# Patient Record
Sex: Female | Born: 1940 | Race: White | Hispanic: No | Marital: Married | Smoking: Former smoker
Health system: Southern US, Community
[De-identification: ages and names within clinical notes are randomized; demographics above are authoritative.]

## PROBLEM LIST (undated history)

## (undated) DIAGNOSIS — I10 Essential (primary) hypertension: Secondary | ICD-10-CM

## (undated) DIAGNOSIS — E039 Hypothyroidism, unspecified: Secondary | ICD-10-CM

## (undated) HISTORY — PX: DILATION AND CURETTAGE OF UTERUS: SHX78

## (undated) HISTORY — DX: Essential (primary) hypertension: I10

## (undated) HISTORY — DX: Hypothyroidism, unspecified: E03.9

---

## 2009-01-02 ENCOUNTER — Emergency Department (HOSPITAL_COMMUNITY): Admission: EM | Admit: 2009-01-02 | Discharge: 2009-01-02 | Payer: Self-pay | Admitting: Emergency Medicine

## 2009-03-30 ENCOUNTER — Emergency Department (HOSPITAL_COMMUNITY): Admission: EM | Admit: 2009-03-30 | Discharge: 2009-03-30 | Payer: Self-pay | Admitting: Emergency Medicine

## 2009-04-01 ENCOUNTER — Emergency Department (HOSPITAL_COMMUNITY): Admission: EM | Admit: 2009-04-01 | Discharge: 2009-04-01 | Payer: Self-pay | Admitting: Emergency Medicine

## 2009-04-04 ENCOUNTER — Emergency Department (HOSPITAL_COMMUNITY): Admission: EM | Admit: 2009-04-04 | Discharge: 2009-04-04 | Payer: Self-pay | Admitting: Emergency Medicine

## 2009-04-08 ENCOUNTER — Ambulatory Visit: Payer: Self-pay | Admitting: Cardiology

## 2009-04-08 DIAGNOSIS — Z87448 Personal history of other diseases of urinary system: Secondary | ICD-10-CM

## 2009-04-08 DIAGNOSIS — I517 Cardiomegaly: Secondary | ICD-10-CM

## 2009-04-08 DIAGNOSIS — I10 Essential (primary) hypertension: Secondary | ICD-10-CM

## 2009-04-14 ENCOUNTER — Ambulatory Visit (HOSPITAL_COMMUNITY): Admission: RE | Admit: 2009-04-14 | Discharge: 2009-04-14 | Payer: Self-pay | Admitting: Urology

## 2009-04-23 ENCOUNTER — Encounter: Payer: Self-pay | Admitting: Cardiology

## 2009-04-23 ENCOUNTER — Ambulatory Visit: Payer: Self-pay

## 2009-04-23 ENCOUNTER — Ambulatory Visit (HOSPITAL_COMMUNITY): Admission: RE | Admit: 2009-04-23 | Discharge: 2009-04-23 | Payer: Self-pay | Admitting: Cardiology

## 2009-04-23 ENCOUNTER — Ambulatory Visit: Payer: Self-pay | Admitting: Internal Medicine

## 2009-04-26 LAB — CONVERTED CEMR LAB
CO2: 28 meq/L (ref 19–32)
Calcium: 9.2 mg/dL (ref 8.4–10.5)
Creatinine, Ser: 0.8 mg/dL (ref 0.4–1.2)
Glucose, Bld: 90 mg/dL (ref 70–99)
Sodium: 135 meq/L (ref 135–145)

## 2009-04-27 ENCOUNTER — Ambulatory Visit: Payer: Self-pay | Admitting: Cardiology

## 2009-04-28 ENCOUNTER — Encounter: Payer: Self-pay | Admitting: Cardiology

## 2009-05-18 LAB — CONVERTED CEMR LAB
Metaneph Total, Ur: 258 ug/24hr (ref 224–832)
Metanephrines, Ur: 36 — ABNORMAL LOW (ref 90–315)

## 2009-05-25 ENCOUNTER — Encounter: Payer: Self-pay | Admitting: Cardiology

## 2009-05-27 ENCOUNTER — Ambulatory Visit: Payer: Self-pay | Admitting: Cardiology

## 2009-09-14 ENCOUNTER — Ambulatory Visit (HOSPITAL_COMMUNITY): Admission: RE | Admit: 2009-09-14 | Discharge: 2009-09-14 | Payer: Self-pay | Admitting: Family Medicine

## 2010-03-23 NOTE — Assessment & Plan Note (Signed)
Summary: [facility]   Visit Type:  Initial Consult Primary Provider:  Ursula Beath, MD  CC:  HTN.  History of Present Illness: This patient presents as a new workup for evaluation of difficult to control hypertension. She has had hypertension for years. However, recently she has had 3 emergency room visits with hypertensive urgency. I have reviewed your records. Most recently she was seen for days ago. Her symptoms start with her face flushing, heart racing and headaches. She has taken her blood pressure and noted it to be as high as 230/108. At the last ER visit her urinalysis was normal. A chemistry demonstrated a slightly low potassium. Her meds were changed from metoprolol to labetalol. This seems to have controlled her blood pressure as over the last 3 days she has had no elevated readings. She feels better and tolerates the current regimen. She is not having any presyncope or syncope. She does exercise occasionally and describes no chest discomfort, neck or arm discomfort. She does not describe shortness of breath, PND or orthopnea.  Of note the patient has a vague history of an enlarged heart seen on echocardiography in the mid 1990s in [place]. She was told she had a heart murmur at that time as well. No other workup was ever undertaken. I do not have these records.  Current Medications (verified): 1)  Nifedical Xl 30 Mg Xr24h-Tab (Nifedipine) .Marland Kitchen.. 1 By Mouth Daily 2)  Cozaar 100 Mg Tabs (Losartan Potassium) .Marland Kitchen.. 1 By Mouth Daily 3)  Levothroid 88 Mcg Tabs (Levothyroxine Sodium) .Marland Kitchen.. 1 By Mouth Daily 4)  Labetalol Hcl 200 Mg Tabs (Labetalol Hcl) .Marland Kitchen.. 1 By Mouth Three Times A Day  Allergies (verified): 1)  ! * Tylenon With Codiene  Past History:  Past Medical History: HTN x years Hematuria Hypothyroid  Past Surgical History: D & C  Family History: There is no family history of early heart disease. There is hypertension in older age first-degree relatives. Her  mother did have a microlumbar and at a later age but died of breast cancer.  Social History: The he is married. She smoked cigarettes only as a teenager. She doesn't drink alcohol. She is retired and has 4 children.  Review of Systems       POtherwise negative for all other systems.  Vital Signs:  Patient profile:   70 year old female Height:      62 inches Weight:      159 pounds BMI:     29.19 Pulse rate:   57 / minute Resp:     16 per minute BP sitting:   130 / 70  (right arm)  Vitals Entered By: Marrion Coy, CNA (April 08, 2009 9:04 AM)  Physical Exam  General:  Well developed, well nourished, in no acute distress. Head:  normocephalic and atraumatic Eyes:  PERRLA/EOM intact; conjunctiva and lids normal. Mouth:  Teeth, gums and palate normal. Oral mucosa normal. Neck:  Neck supple, no JVD. No masses, thyromegaly or abnormal cervical nodes. Chest Wall:  no deformities or breast masses noted Lungs:  Clear bilaterally to auscultation and percussion.rales R base.  rales R base.   Abdomen:  Bowel sounds positive; abdomen soft and non-tender without masses, organomegaly, or hernias noted. No hepatosplenomegaly. Msk:  Back normal, normal gait. Muscle strength and tone normal. Extremities:  No clubbing or cyanosis. Neurologic:  Alert and oriented x 3. Skin:  Intact without lesions or rashes. Cervical Nodes:  no significant adenopathy Axillary Nodes:  no significant adenopathy Inguinal  Nodes:  no significant adenopathy Psych:  Normal affect.   Detailed Cardiovascular Exam  Neck    Carotids: Carotids full and equal bilaterally without bruits.      Neck Veins: Normal, no JVD.    Heart    Inspection: no deformities or lifts noted.      Palpation: normal PMI with no thrills palpable.      Auscultation: regular rate and rhythm, S1, S2 without murmurs, rubs, gallops, or clicks.    Vascular    Abdominal Aorta: no palpable masses, pulsations, or audible bruits.       Femoral Pulses: normal femoral pulses bilaterally.      Pedal Pulses: normal pedal pulses bilaterally.      Radial Pulses: normal radial pulses bilaterally.      Peripheral Circulation: no clubbing, cyanosis, or edema noted with normal capillary refill.     EKG  Procedure date:  04/08/2009  Findings:      sinus bradycardia, rate 57, axis within normal limits, early repolarization pattern, no acute ST-T wave changes.  Impression & Recommendations:  Problem # 1:  HYPERTENSION, MILD (ICD-401.1) The patient has had paroxysms of hypertensive urgency. She seems to be well treated currently. I will continue the same medicines. I will order a 24-hour urine for VMA and metanephrine. I will check an aldosterone level. Further evaluation will be pending future blood pressure readings and the results of the above.  Orders: EKG w/ Interpretation (93000) Echocardiogram (Echo)  Problem # 2:  VENTRICULAR HYPERTROPHY, LEFT (ICD-429.3) The Patient reports a history of "cardiac enlargement". I suspect LVH. She does have a slightly abnormal EKG which is unusual for her age. I will followup and echocardiogram. Orders: EKG w/ Interpretation (93000) Echocardiogram (Echo)  Problem # 3:  HEMATURIA, HX OF (ICD-V13.09) She has been seeing a urologist for this and evaluation is ongoing.  Patient Instructions: 1)  Your physician recommends that you schedule a follow-up appointment after testing as needed 2)  Your physician recommends that you return for lab work on same day as your echo  24 hr urine for VMA/meta, aldosterone level, and a basic metabolic panel  401.1 3)  Your physician recommends that you continue on your current medications as directed. Please refer to the Current Medication list given to you today. 4)  Your physician has requested that you have an echocardiogram.  Echocardiography is a painless test that uses sound waves to create images of your heart. It provides your doctor with information  about the size and shape of your heart and how well your heart's chambers and valves are working.  This procedure takes approximately one hour. There are no restrictions for this procedure. Prescriptions: LABETALOL HCL 200 MG TABS (LABETALOL HCL) 1 by mouth three times a day  #90 x 11   Entered by:   Charolotte Capuchin, RN   Authorized by:   Rollene Rotunda, MD, [facility]   Signed by:   Charolotte Capuchin, RN on 04/08/2009   Method used:   Electronically to        [facility] [address] [phone]* (retail)       [address], [place]  [address]       Ph: [phone]       Fax: [phone]   RxID:   [id number]

## 2010-03-23 NOTE — Miscellaneous (Signed)
  Clinical Lists Changes  Observations: Added new observation of ECHOINTERP: - Left ventricle: Wall thickness was increased in a pattern of mild       LVH. Systolic function was normal. The estimated ejection fraction       was in the range of 60% to 65%. Doppler parameters are consistent       with abnormal left ventricular relaxation (grade 1 diastolic       dysfunction).     - Mitral valve: Calcified annulus. Mildly thickened leaflets . (04/23/2009 12:13)      Echocardiogram  Procedure date:  04/23/2009  Findings:      - Left ventricle: Wall thickness was increased in a pattern of mild       LVH. Systolic function was normal. The estimated ejection fraction       was in the range of 60% to 65%. Doppler parameters are consistent       with abnormal left ventricular relaxation (grade 1 diastolic       dysfunction).     - Mitral valve: Calcified annulus. Mildly thickened leaflets .

## 2010-03-23 NOTE — Assessment & Plan Note (Signed)
Summary: [facility]   Visit Type:  Follow-up Primary Provider:  Ursula Beath, MD  CC:  HTN.  History of Present Illness: The patient presents for followup of difficult to control hypertension. Since I last saw her she has had no new complaints. She actually brings a blood pressure diary which demonstrates very well-controlled blood pressures. She's been given a medication for anxiety which she has to take rarely. Of note at the last visit there was a question of a previous diagnosis of cardiomegaly on ultrasound years ago in [place]. An echocardiogram that I ordered demonstrated a well-preserved ejection fraction and very mild LVH. She denies any new symptoms such as chest discomfort, neck or arm discomfort. She is not having palpitations, presyncope or syncope. She has no shortness of breath, PND or orthopnea.  Current Medications (verified): 1)  Nifedical Xl 30 Mg Xr24h-Tab (Nifedipine) .Marland Kitchen.. 1 By Mouth Daily 2)  Cozaar 100 Mg Tabs (Losartan Potassium) .Marland Kitchen.. 1 By Mouth Daily 3)  Levothroid 75 Mcg Tabs (Levothyroxine Sodium) .Marland Kitchen.. 1 P Odaily 4)  Labetalol Hcl 200 Mg Tabs (Labetalol Hcl) .Marland Kitchen.. 1 By Mouth Three Times A Day 5)  Tranxene-T 3.75 Mg Tabs (Clorazepate Dipotassium) .... As Needed  Allergies (verified): 1)  ! * Tylenon With Codiene  Past History:  Past Medical History: Reviewed history from 04/08/2009 and no changes required. HTN x years Hematuria Hypothyroid  Past Surgical History: Reviewed history from 04/08/2009 and no changes required. D & C  Review of Systems       As stated in the HPI and negative for all other systems.   Vital Signs:  Patient profile:   70 year old female Height:      62 inches Weight:      157 pounds BMI:     28.82 Pulse rate:   55 / minute Resp:     16 per minute BP sitting:   148 / 78  (right arm)  Vitals Entered By: Marrion Coy, CNA (May 27, 2009 1:33 PM)  Physical Exam  General:  Well developed, well nourished, in  no acute distress. Head:  normocephalic and atraumatic Neck:  Neck supple, no JVD. No masses, thyromegaly or abnormal cervical nodes. Chest Wall:  no deformities or breast masses noted Lungs:  Clear bilaterally to auscultation and percussion.rales R base.  rales R base.   Abdomen:  Bowel sounds positive; abdomen soft and non-tender without masses, organomegaly, or hernias noted. No hepatosplenomegaly. Msk:  Back normal, normal gait. Muscle strength and tone normal. Extremities:  No clubbing or cyanosis. Neurologic:  Alert and oriented x 3. Skin:  Intact without lesions or rashes. Psych:  Normal affect.   Impression & Recommendations:  Problem # 1:  HYPERTENSION, MILD (ICD-401.1) The patient's blood pressure is well controlled. No change in therapy is indicated. She had negative VMA and metanephrines. At this point I don't think further evaluation for secondary etiology is warranted. If in the future her blood pressure is more difficult to control that there is evidence of renal insufficiency she would need renal artery Dopplers. I did review a recent CAT scan done at [facility] which was ordered by her urologist. There were no significant abnormalities though I don't see specific tension of her renal arteries on this contrast study.  Problem # 2:  VENTRICULAR HYPERTROPHY, LEFT (ICD-429.3) This is mild and should be treated with blood pressure management but no other evaluation is needed.

## 2010-04-07 ENCOUNTER — Encounter: Payer: Self-pay | Admitting: Cardiology

## 2010-04-20 ENCOUNTER — Other Ambulatory Visit (HOSPITAL_COMMUNITY): Payer: Self-pay | Admitting: Family Medicine

## 2010-04-20 DIAGNOSIS — D329 Benign neoplasm of meninges, unspecified: Secondary | ICD-10-CM

## 2010-04-23 ENCOUNTER — Ambulatory Visit (HOSPITAL_COMMUNITY)
Admission: RE | Admit: 2010-04-23 | Discharge: 2010-04-23 | Disposition: A | Discharge status: Home / Self Care | Payer: Medicare HMO | Source: Ambulatory Visit | Attending: Family Medicine | Admitting: Family Medicine

## 2010-04-23 DIAGNOSIS — D320 Benign neoplasm of cerebral meninges: Secondary | ICD-10-CM | POA: Insufficient documentation

## 2010-04-23 DIAGNOSIS — D329 Benign neoplasm of meninges, unspecified: Secondary | ICD-10-CM

## 2010-04-23 DIAGNOSIS — Z09 Encounter for follow-up examination after completed treatment for conditions other than malignant neoplasm: Secondary | ICD-10-CM | POA: Insufficient documentation

## 2010-04-23 MED ORDER — IOHEXOL 300 MG/ML  SOLN
75.0000 mL | Freq: Once | INTRAMUSCULAR | Status: AC | PRN
  Administered 2010-04-23: 75 mL via INTRAVENOUS

## 2010-05-13 LAB — CBC
HCT: 42.2 % (ref 36.0–46.0)
HCT: 46.2 % — ABNORMAL HIGH (ref 36.0–46.0)
Hemoglobin: 14.4 g/dL (ref 12.0–15.0)
Hemoglobin: 15.8 g/dL — ABNORMAL HIGH (ref 12.0–15.0)
MCHC: 34.2 g/dL (ref 30.0–36.0)
RBC: 4.66 MIL/uL (ref 3.87–5.11)
RDW: 12.4 % (ref 11.5–15.5)
WBC: 10.1 10*3/uL (ref 4.0–10.5)

## 2010-05-13 LAB — URINE CULTURE
Colony Count: NO GROWTH
Culture: NO GROWTH

## 2010-05-13 LAB — POCT CARDIAC MARKERS
CKMB, poc: 3.3 ng/mL (ref 1.0–8.0)
Troponin i, poc: <0.05 ng/mL (ref 0.00–0.09)

## 2010-05-13 LAB — DIFFERENTIAL
Basophils Absolute: 0 10*3/uL (ref 0.0–0.1)
Basophils Relative: 0 % (ref 0–1)
Eosinophils Absolute: 0.1 10*3/uL (ref 0.0–0.7)
Eosinophils Absolute: 0.1 10*3/uL (ref 0.0–0.7)
Eosinophils Relative: 1 % (ref 0–5)
Eosinophils Relative: 1 % (ref 0–5)
Lymphocytes Relative: 23 % (ref 12–46)
Lymphs Abs: 2 10*3/uL (ref 0.7–4.0)
Lymphs Abs: 2.1 10*3/uL (ref 0.7–4.0)
Monocytes Absolute: 0.5 10*3/uL (ref 0.1–1.0)
Monocytes Relative: 5 % (ref 3–12)
Neutro Abs: 7.2 10*3/uL (ref 1.7–7.7)
Neutrophils Relative %: 72 % (ref 43–77)

## 2010-05-13 LAB — URINE MICROSCOPIC-ADD ON

## 2010-05-13 LAB — URINALYSIS, ROUTINE W REFLEX MICROSCOPIC
Bilirubin Urine: NEGATIVE
Leukocytes, UA: NEGATIVE
Nitrite: NEGATIVE
Urobilinogen, UA: 0.2 mg/dL (ref 0.0–1.0)
pH: 5.5 (ref 5.0–8.0)

## 2010-05-13 LAB — COMPREHENSIVE METABOLIC PANEL
ALT: 21 U/L (ref 0–35)
Albumin: 4.4 g/dL (ref 3.5–5.2)
BUN: 13 mg/dL (ref 6–23)
CO2: 25 mEq/L (ref 19–32)
Calcium: 9.8 mg/dL (ref 8.4–10.5)
Creatinine, Ser: 0.81 mg/dL (ref 0.4–1.2)
GFR calc Af Amer: >60 mL/min (ref >60)
Glucose, Bld: 115 mg/dL — ABNORMAL HIGH (ref 70–99)
Sodium: 136 mEq/L (ref 135–145)

## 2010-05-13 LAB — BASIC METABOLIC PANEL
GFR calc Af Amer: >60 mL/min (ref >60)
Potassium: 3.4 mEq/L — ABNORMAL LOW (ref 3.5–5.1)
Sodium: 133 mEq/L — ABNORMAL LOW (ref 135–145)

## 2010-05-26 LAB — URINALYSIS, ROUTINE W REFLEX MICROSCOPIC
Glucose, UA: NEGATIVE mg/dL
Leukocytes, UA: NEGATIVE
Protein, ur: NEGATIVE mg/dL
Urobilinogen, UA: 0.2 mg/dL (ref 0.0–1.0)

## 2010-05-26 LAB — BASIC METABOLIC PANEL
BUN: 16 mg/dL (ref 6–23)
Calcium: 10 mg/dL (ref 8.4–10.5)

## 2010-05-26 LAB — URINE CULTURE

## 2010-05-26 LAB — URINE MICROSCOPIC-ADD ON

## 2010-07-27 ENCOUNTER — Other Ambulatory Visit (HOSPITAL_COMMUNITY): Payer: Self-pay | Admitting: Family Medicine

## 2010-07-27 DIAGNOSIS — Z139 Encounter for screening, unspecified: Secondary | ICD-10-CM

## 2010-09-08 ENCOUNTER — Other Ambulatory Visit: Payer: Self-pay | Admitting: *Deleted

## 2010-09-08 MED ORDER — LABETALOL HCL 200 MG PO TABS: 200.0000 mg | ORAL_TABLET | Freq: Three times a day (TID) | ORAL | Status: DC

## 2010-09-17 ENCOUNTER — Ambulatory Visit (HOSPITAL_COMMUNITY)
Admission: RE | Admit: 2010-09-17 | Discharge: 2010-09-17 | Disposition: A | Discharge status: Home / Self Care | Payer: Medicare HMO | Source: Ambulatory Visit | Attending: Family Medicine | Admitting: Family Medicine

## 2010-09-17 DIAGNOSIS — Z1231 Encounter for screening mammogram for malignant neoplasm of breast: Secondary | ICD-10-CM | POA: Insufficient documentation

## 2010-09-17 DIAGNOSIS — Z139 Encounter for screening, unspecified: Secondary | ICD-10-CM

## 2010-09-30 ENCOUNTER — Encounter: Payer: Self-pay | Admitting: *Deleted

## 2010-09-30 ENCOUNTER — Encounter: Payer: Self-pay | Admitting: Cardiology

## 2010-10-01 ENCOUNTER — Encounter: Payer: Self-pay | Admitting: Cardiology

## 2011-05-02 ENCOUNTER — Other Ambulatory Visit: Payer: Self-pay | Admitting: Cardiology

## 2011-05-02 NOTE — Telephone Encounter (Signed)
..   Requested Prescriptions   Pending Prescriptions Disp Refills  . labetalol (NORMODYNE) 200 MG tablet [Pharmacy Med Name: LABETALOL HCL 200MG  TABLE] 90 tablet 1    Sig: TAKE (1) TABLET BY MOUTH (3) TIMES DAILY.  Pt needs to make an appointment to continue getting meds

## 2011-05-04 ENCOUNTER — Encounter: Payer: Self-pay | Admitting: Cardiology

## 2011-05-04 ENCOUNTER — Ambulatory Visit (INDEPENDENT_AMBULATORY_CARE_PROVIDER_SITE_OTHER): Payer: Medicare Other | Admitting: Cardiology

## 2011-05-04 VITALS — BP 150/70 | HR 47 | Ht 62.0 in | Wt 164.0 lb

## 2011-05-04 DIAGNOSIS — R0989 Other specified symptoms and signs involving the circulatory and respiratory systems: Secondary | ICD-10-CM

## 2011-05-04 DIAGNOSIS — I10 Essential (primary) hypertension: Secondary | ICD-10-CM

## 2011-05-04 DIAGNOSIS — I517 Cardiomegaly: Secondary | ICD-10-CM

## 2011-05-04 NOTE — Patient Instructions (Signed)
Continue current medications as listed.  Your physician has requested that you have a carotid duplex. This test is an ultrasound of the carotid arteries in your neck. It looks at blood flow through these arteries that supply the brain with blood. Allow one hour for this exam. There are no restrictions or special instructions.  Follow up in 1 year with Dr Antoine Poche.  You will receive a letter in the mail 2 months before you are due.  Please call us when you receive this letter to schedule your follow up appointment.

## 2011-05-04 NOTE — Assessment & Plan Note (Signed)
The blood pressure is at target. No change in medications is indicated. We will continue with therapeutic lifestyle changes (TLC).  

## 2011-05-04 NOTE — Assessment & Plan Note (Signed)
She does have a left carotid bruit which I didn't appreciate previously. I will get a carotid Doppler.

## 2011-05-04 NOTE — Assessment & Plan Note (Signed)
This was evaluated the previous echocardiogram. No further evaluation is warranted.

## 2011-05-04 NOTE — Progress Notes (Signed)
   HPI   The patient presents for followup of her difficult to control blood pressure. She's had previous workup of this including renal Dopplers and screening for pheochromocytoma. There's been no clear etiology. She does have LVH somewhat on echo. Since last saw her she's had excellent blood pressure control. She has had none of the hypertensive urgency he was having. The patient denies any new symptoms such as chest discomfort, neck or arm discomfort. There has been no new shortness of breath, PND or orthopnea. There have been no reported palpitations, presyncope or syncope.  She's not exercising as much as I would like right now though she plans on getting back into this.  Allergies  Allergen Reactions  . Percocet (Oxycodone-Acetaminophen)     Current Outpatient Prescriptions  Medication Sig Dispense Refill  . labetalol (NORMODYNE) 200 MG tablet TAKE (1) TABLET BY MOUTH (3) TIMES DAILY.  90 tablet  1  . levothyroxine (SYNTHROID, LEVOTHROID) 75 MCG tablet Take 75 mcg by mouth daily.        Marland Kitchen losartan (COZAAR) 100 MG tablet Take 100 mg by mouth daily.        Marland Kitchen NIFEdipine (PROCARDIA XL/ADALAT-CC) 30 MG 24 hr tablet Take 30 mg by mouth daily.          Past Medical History  Diagnosis Date  . HTN (hypertension)   . Hematuria   . Hypothyroid     Past Surgical History  Procedure Date  . Dilation and curettage of uterus     ROS:  As stated in the HPI and negative for all other systems.  PHYSICAL EXAM BP 150/70  Pulse 47  Ht 5\' 2"  (1.575 m)  Wt 164 lb (74.39 kg)  BMI 30.00 kg/m2 GENERAL:  Well appearing HEENT:  Pupils equal round and reactive, fundi not visualized, oral mucosa unremarkable NECK:  No jugular venous distention, waveform within normal limits, carotid upstroke brisk and symmetric, left bruits, no thyromegaly LYMPHATICS:  No cervical, inguinal adenopathy LUNGS:  Clear to auscultation bilaterally BACK:  No CVA tenderness CHEST:  Unremarkable HEART:  PMI not displaced  or sustained,S1 and S2 within normal limits, no S3, no S4, no clicks, no rubs, soft left upper murmur systolic early peaking. ABD:  Flat, positive bowel sounds normal in frequency in pitch, no bruits, no rebound, no guarding, no midline pulsatile mass, no hepatomegaly, no splenomegaly EXT:  2 plus pulses throughout, no edema, no cyanosis no clubbing SKIN:  No rashes no nodules NEURO:  Cranial nerves II through XII grossly intact, motor grossly intact throughout PSYCH:  Cognitively intact, oriented to person place and time  EKG:  Sinus bradycardia, rate 47, axis within normal, intervals within normal limits, early repolarization.  05/04/2011   ASSESSMENT AND PLAN

## 2011-05-13 ENCOUNTER — Encounter (INDEPENDENT_AMBULATORY_CARE_PROVIDER_SITE_OTHER): Payer: Medicare Other

## 2011-05-13 DIAGNOSIS — R0989 Other specified symptoms and signs involving the circulatory and respiratory systems: Secondary | ICD-10-CM

## 2011-07-01 ENCOUNTER — Other Ambulatory Visit: Payer: Self-pay | Admitting: Cardiology

## 2011-09-05 ENCOUNTER — Other Ambulatory Visit (HOSPITAL_COMMUNITY): Payer: Self-pay | Admitting: Family Medicine

## 2011-09-05 DIAGNOSIS — Z139 Encounter for screening, unspecified: Secondary | ICD-10-CM

## 2011-09-20 ENCOUNTER — Ambulatory Visit (HOSPITAL_COMMUNITY)
Admission: RE | Admit: 2011-09-20 | Discharge: 2011-09-20 | Disposition: A | Discharge status: Home / Self Care | Payer: Medicare Other | Source: Ambulatory Visit | Attending: Family Medicine | Admitting: Family Medicine

## 2011-09-20 DIAGNOSIS — Z139 Encounter for screening, unspecified: Secondary | ICD-10-CM

## 2011-09-20 DIAGNOSIS — Z1231 Encounter for screening mammogram for malignant neoplasm of breast: Secondary | ICD-10-CM | POA: Insufficient documentation

## 2012-07-04 ENCOUNTER — Encounter: Payer: Self-pay | Admitting: Cardiology

## 2012-07-04 ENCOUNTER — Ambulatory Visit (INDEPENDENT_AMBULATORY_CARE_PROVIDER_SITE_OTHER): Payer: Medicare Other | Admitting: Cardiology

## 2012-07-04 VITALS — BP 141/69 | HR 55 | Ht 62.0 in | Wt 165.0 lb

## 2012-07-04 DIAGNOSIS — I10 Essential (primary) hypertension: Secondary | ICD-10-CM

## 2012-07-04 MED ORDER — LABETALOL HCL 200 MG PO TABS: 200.0000 mg | ORAL_TABLET | Freq: Three times a day (TID) | ORAL | Status: DC

## 2012-07-04 MED ORDER — LOSARTAN POTASSIUM 100 MG PO TABS: 100.0000 mg | ORAL_TABLET | Freq: Every day | ORAL | Status: DC

## 2012-07-04 MED ORDER — NIFEDIPINE ER OSMOTIC RELEASE 30 MG PO TB24: 30.0000 mg | ORAL_TABLET | Freq: Every day | ORAL | Status: DC

## 2012-07-04 NOTE — Progress Notes (Signed)
   HPI   The patient presents for followup of her difficult to control blood pressure. She's had previous workup of this including renal Dopplers and screening for pheochromocytoma. There's been no clear etiology. She does have LVH somewhat on echo.  Since last saw her she's had excellent blood pressure control. She has had none of the hypertensive urgency she was having and her husband reports no panic.. The patient denies any new symptoms such as chest discomfort, neck or arm discomfort. There has been no new shortness of breath, PND or orthopnea. There have been no reported palpitations, presyncope or syncope.  She's not exercising as much as I would like although she is active all time.  Allergies  Allergen Reactions  . Percocet (Oxycodone-Acetaminophen)     Current Outpatient Prescriptions  Medication Sig Dispense Refill  . labetalol (NORMODYNE) 200 MG tablet Take 200 mg by mouth 3 (three) times daily.      Marland Kitchen levothyroxine (SYNTHROID, LEVOTHROID) 75 MCG tablet Take 75 mcg by mouth daily.        Marland Kitchen losartan (COZAAR) 100 MG tablet Take 100 mg by mouth daily.        Marland Kitchen NIFEdipine (PROCARDIA XL/ADALAT-CC) 30 MG 24 hr tablet Take 30 mg by mouth daily.         No current facility-administered medications for this visit.    Past Medical History  Diagnosis Date  . HTN (hypertension)   . Hematuria   . Hypothyroid     Past Surgical History  Procedure Laterality Date  . Dilation and curettage of uterus      ROS:  As stated in the HPI and negative for all other systems.  PHYSICAL EXAM BP 141/69  Pulse 55  Ht 5\' 2"  (1.575 m)  Wt 165 lb (74.844 kg)  BMI 30.17 kg/m2 GENERAL:  Well appearing HEENT:  Pupils equal round and reactive, fundi not visualized, oral mucosa unremarkable NECK:  No jugular venous distention, waveform within normal limits, carotid upstroke brisk and symmetric, no bruits, no thyromegaly LYMPHATICS:  No cervical, inguinal adenopathy LUNGS:  Clear to auscultation  bilaterally BACK:  No CVA tenderness CHEST:  Unremarkable HEART:  PMI not displaced or sustained,S1 and S2 within normal limits, no S3, no S4, no clicks, no rubs, no murmurs ABD:  Flat, positive bowel sounds normal in frequency in pitch, no bruits, no rebound, no guarding, no midline pulsatile mass, no hepatomegaly, no splenomegaly EXT:  2 plus pulses throughout, no edema, no cyanosis no clubbing SKIN:  No rashes no nodules NEURO:  Cranial nerves II through XII grossly intact, motor grossly intact throughout PSYCH:  Cognitively intact, oriented to person place and time   ASSESSMENT AND PLAN  HTN:  Her blood pressure is well controlled. She will continue the meds as listed. No change in therapy is indicated.  She prefers to be followed in this clinic yearly given her previous problems with blood pressure.

## 2012-07-04 NOTE — Patient Instructions (Addendum)
The current medical regimen is effective;  continue present plan and medications.  Follow up in 1 year with Dr Hochrein.  You will receive a letter in the mail 2 months before you are due.  Please call us when you receive this letter to schedule your follow up appointment.  

## 2012-09-04 ENCOUNTER — Other Ambulatory Visit (HOSPITAL_COMMUNITY): Payer: Self-pay | Admitting: Family Medicine

## 2012-09-04 DIAGNOSIS — Z139 Encounter for screening, unspecified: Secondary | ICD-10-CM

## 2012-09-20 ENCOUNTER — Ambulatory Visit (HOSPITAL_COMMUNITY)
Admission: RE | Admit: 2012-09-20 | Discharge: 2012-09-20 | Disposition: A | Discharge status: Home / Self Care | Payer: Medicare Other | Source: Ambulatory Visit | Attending: Family Medicine | Admitting: Family Medicine

## 2012-09-20 DIAGNOSIS — Z139 Encounter for screening, unspecified: Secondary | ICD-10-CM

## 2012-09-20 DIAGNOSIS — Z1231 Encounter for screening mammogram for malignant neoplasm of breast: Secondary | ICD-10-CM | POA: Insufficient documentation

## 2012-09-21 ENCOUNTER — Other Ambulatory Visit: Payer: Self-pay | Admitting: Family Medicine

## 2012-09-21 DIAGNOSIS — R928 Other abnormal and inconclusive findings on diagnostic imaging of breast: Secondary | ICD-10-CM

## 2012-10-10 ENCOUNTER — Ambulatory Visit (HOSPITAL_COMMUNITY)
Admission: RE | Admit: 2012-10-10 | Discharge: 2012-10-10 | Disposition: A | Discharge status: Home / Self Care | Payer: Medicare Other | Source: Ambulatory Visit | Attending: Family Medicine | Admitting: Family Medicine

## 2012-10-10 DIAGNOSIS — Z1231 Encounter for screening mammogram for malignant neoplasm of breast: Secondary | ICD-10-CM | POA: Insufficient documentation

## 2012-10-10 DIAGNOSIS — R928 Other abnormal and inconclusive findings on diagnostic imaging of breast: Secondary | ICD-10-CM

## 2013-05-03 ENCOUNTER — Other Ambulatory Visit: Payer: Self-pay

## 2013-05-03 MED ORDER — LOSARTAN POTASSIUM 100 MG PO TABS: 100.0000 mg | ORAL_TABLET | Freq: Every day | ORAL | Status: DC

## 2013-05-03 MED ORDER — NIFEDIPINE ER OSMOTIC RELEASE 30 MG PO TB24: 30.0000 mg | ORAL_TABLET | Freq: Every day | ORAL | Status: DC

## 2013-05-03 MED ORDER — LABETALOL HCL 200 MG PO TABS: 200.0000 mg | ORAL_TABLET | Freq: Three times a day (TID) | ORAL | Status: DC

## 2013-06-15 ENCOUNTER — Other Ambulatory Visit: Payer: Self-pay | Admitting: Cardiology

## 2013-07-03 ENCOUNTER — Other Ambulatory Visit: Payer: Self-pay | Admitting: Cardiology

## 2013-07-17 ENCOUNTER — Ambulatory Visit (INDEPENDENT_AMBULATORY_CARE_PROVIDER_SITE_OTHER): Payer: Medicare HMO | Admitting: Cardiology

## 2013-07-17 ENCOUNTER — Encounter: Payer: Self-pay | Admitting: Cardiology

## 2013-07-17 VITALS — BP 110/63 | HR 49 | Ht 62.0 in | Wt 167.0 lb

## 2013-07-17 DIAGNOSIS — I10 Essential (primary) hypertension: Secondary | ICD-10-CM

## 2013-07-17 DIAGNOSIS — I517 Cardiomegaly: Secondary | ICD-10-CM

## 2013-07-17 MED ORDER — LABETALOL HCL 200 MG PO TABS: ORAL_TABLET | ORAL | Status: DC

## 2013-07-17 MED ORDER — LOSARTAN POTASSIUM 100 MG PO TABS
ORAL_TABLET | ORAL | Status: DC
Start: 2013-07-17 — End: 2014-07-29

## 2013-07-17 MED ORDER — NIFEDIPINE ER OSMOTIC RELEASE 30 MG PO TB24: ORAL_TABLET | ORAL | Status: DC

## 2013-07-17 NOTE — Patient Instructions (Signed)
The current medical regimen is effective;  continue present plan and medications.  Follow up in 1 year with Dr Hochrein.  You will receive a letter in the mail 2 months before you are due.  Please call us when you receive this letter to schedule your follow up appointment.  

## 2013-07-17 NOTE — Progress Notes (Signed)
   HPI   The patient presents for followup of her difficult to control blood pressure. She's had previous workup of this including renal Dopplers and screening for pheochromocytoma. There's been no clear etiology. She does have LVH mildly on echo. Since last saw her one year ago she's had excellent blood pressure control. She has had none of the hypertensive urgency she has had or ER visits. . The patient denies any new symptoms such as chest discomfort, neck or arm discomfort. There has been no new shortness of breath, PND or orthopnea. There have been no reported palpitations, presyncope or syncope.  She's not exercising as much as I would like although she is active all time at home.   Allergies  Allergen Reactions  . Percocet [Oxycodone-Acetaminophen]     Current Outpatient Prescriptions  Medication Sig Dispense Refill  . labetalol (NORMODYNE) 200 MG tablet TAKE (1) TABLET BY MOUTH (3) TIMES DAILY.  90 tablet  1  . levothyroxine (SYNTHROID, LEVOTHROID) 75 MCG tablet Take 75 mcg by mouth daily.        Marland Kitchen losartan (COZAAR) 100 MG tablet TAKE ONE TABLET BY MOUTH ONCE DAILY.  30 tablet  0  . NIFEdipine (PROCARDIA-XL/ADALAT-CC/NIFEDICAL-XL) 30 MG 24 hr tablet TAKE ONE TABLET DAILY.  90 tablet  1   No current facility-administered medications for this visit.    Past Medical History  Diagnosis Date  . HTN (hypertension)   . Hematuria   . Hypothyroid     Past Surgical History  Procedure Laterality Date  . Dilation and curettage of uterus      ROS:  As stated in the HPI and negative for all other systems.  PHYSICAL EXAM BP 110/63  Pulse 49  Ht 5\' 2"  (1.575 m)  Wt 167 lb (75.751 kg)  BMI 30.54 kg/m2 GENERAL:  Well appearing NECK:  No jugular venous distention, waveform within normal limits, carotid upstroke brisk and symmetric, no bruits, no thyromegaly LUNGS:  Clear to auscultation bilaterally CHEST:  Unremarkable HEART:  PMI not displaced or sustained,S1 and S2 within normal  limits, no S3, no S4, no clicks, no rubs, no murmurs ABD:  Flat, positive bowel sounds normal in frequency in pitch, no bruits, no rebound, no guarding, no midline pulsatile mass, no hepatomegaly, no splenomegaly EXT:  2 plus pulses throughout, no edema, no cyanosis no clubbing  EKG:  Sinus bradycardia, rate 49, axis WNL, intervals WNL, no acute ST T wave changes, early repolarization changes.  07/17/2013  ASSESSMENT AND PLAN  HTN:  Her blood pressure is well controlled. She will continue the meds as listed. No change in therapy is indicated.  She prefers to be followed in this clinic yearly given her previous problems with blood pressure.

## 2013-09-04 ENCOUNTER — Encounter: Payer: Self-pay | Admitting: Cardiology

## 2013-09-04 ENCOUNTER — Ambulatory Visit (INDEPENDENT_AMBULATORY_CARE_PROVIDER_SITE_OTHER): Payer: Medicare HMO | Admitting: Cardiology

## 2013-09-04 VITALS — BP 131/71 | HR 61 | Ht 61.1 in | Wt 161.0 lb

## 2013-09-04 DIAGNOSIS — I517 Cardiomegaly: Secondary | ICD-10-CM

## 2013-09-04 DIAGNOSIS — I10 Essential (primary) hypertension: Secondary | ICD-10-CM

## 2013-09-04 NOTE — Patient Instructions (Addendum)
The current medical regimen is effective;  continue present plan and medications.  Follow up in 3 months with Dr Percival Spanish in the [place] office.

## 2013-09-04 NOTE — Progress Notes (Signed)
   HPI   The patient presents for followup of her difficult to control blood pressure. She's had previous workup of this including renal Dopplers and screening for pheochromocytoma. There's been no clear etiology. She does have LVH mildly on echo.  Since I last saw her her BP has been well controlled.  However, she comes into day for follow up and she is sad, tearful and describes severe stress.  She also says that with this stress she has had some left sided chest pain.  This is like a cramp and under the left breast and in the left axilla.  It does not happen with physical stress and she can walk the dog and do yard work without symptoms.  The patient denies any new symptoms such as neck or arm discomfort. There has been no new shortness of breath, PND or orthopnea. There have been no reported palpitations, presyncope or syncope.  Allergies  Allergen Reactions  . Percocet [Oxycodone-Acetaminophen]     Current Outpatient Prescriptions  Medication Sig Dispense Refill  . labetalol (NORMODYNE) 200 MG tablet TAKE (1) TABLET BY MOUTH (3) TIMES DAILY.  90 tablet  11  . levothyroxine (SYNTHROID, LEVOTHROID) 75 MCG tablet Take 75 mcg by mouth daily.        Marland Kitchen losartan (COZAAR) 100 MG tablet TAKE ONE TABLET BY MOUTH ONCE DAILY.  30 tablet  11  . NIFEdipine (PROCARDIA-XL/ADALAT-CC/NIFEDICAL-XL) 30 MG 24 hr tablet TAKE ONE TABLET DAILY.  30 tablet  11   No current facility-administered medications for this visit.    Past Medical History  Diagnosis Date  . HTN (hypertension)   . Hematuria   . Hypothyroid     Past Surgical History  Procedure Laterality Date  . Dilation and curettage of uterus      ROS:  As stated in the HPI and negative for all other systems.  PHYSICAL EXAM BP 131/71  Pulse 61  Ht 5' 1.1" (1.552 m)  Wt 161 lb (73.029 kg)  BMI 30.32 kg/m2 GENERAL:  Well appearing NECK:  No jugular venous distention, waveform within normal limits, carotid upstroke brisk and symmetric, no  bruits, no thyromegaly LUNGS:  Clear to auscultation bilaterally CHEST:  Unremarkable HEART:  PMI not displaced or sustained,S1 and S2 within normal limits, no S3, no S4, no clicks, no rubs, no murmurs ABD:  Flat, positive bowel sounds normal in frequency in pitch, no bruits, no rebound, no guarding, no midline pulsatile mass, no hepatomegaly, no splenomegaly EXT:  2 plus pulses throughout, no edema, no cyanosis no clubbing PSYCH:   Tearful  EKG:  Sinus bradycardia, rate 49, axis WNL, intervals WNL, no acute ST T wave changes, early repolarization changes.  09/04/2013  ASSESSMENT AND PLAN  HTN:  Her blood pressure is well controlled. She will continue the meds as listed. No change in therapy is indicated.  She prefers to be followed in this clinic yearly given her previous problems with blood pressure.  ANXIETY:  She is very emotional in the office today.  I spent quite a bit of time talking to her and her husband about this.  She needs to see her primary MD about this.

## 2013-09-25 ENCOUNTER — Other Ambulatory Visit (HOSPITAL_COMMUNITY): Payer: Self-pay | Admitting: Family Medicine

## 2013-09-25 DIAGNOSIS — Z1231 Encounter for screening mammogram for malignant neoplasm of breast: Secondary | ICD-10-CM

## 2013-10-14 ENCOUNTER — Ambulatory Visit (HOSPITAL_COMMUNITY)
Admission: RE | Admit: 2013-10-14 | Discharge: 2013-10-14 | Disposition: A | Discharge status: Home / Self Care | Payer: Medicare HMO | Source: Ambulatory Visit | Attending: Family Medicine | Admitting: Family Medicine

## 2013-10-14 DIAGNOSIS — Z1231 Encounter for screening mammogram for malignant neoplasm of breast: Secondary | ICD-10-CM | POA: Diagnosis not present

## 2013-12-18 ENCOUNTER — Encounter: Payer: Self-pay | Admitting: Cardiology

## 2013-12-18 ENCOUNTER — Ambulatory Visit (INDEPENDENT_AMBULATORY_CARE_PROVIDER_SITE_OTHER): Payer: Medicare HMO | Admitting: Cardiology

## 2013-12-18 VITALS — BP 120/62 | HR 42 | Ht 61.5 in | Wt 168.0 lb

## 2013-12-18 DIAGNOSIS — I10 Essential (primary) hypertension: Secondary | ICD-10-CM

## 2013-12-18 NOTE — Patient Instructions (Signed)
The current medical regimen is effective;  continue present plan and medications.  Follow up in 1 year with Dr. Hochrein in [place].  You will receive a letter in the mail 2 months before you are due.  Please call us when you receive this letter to schedule your follow up appointment.  

## 2013-12-18 NOTE — Progress Notes (Signed)
   HPI   The patient presents for followup of her difficult to control blood pressure. She's had previous workup of this including renal Dopplers and screening for pheochromocytoma. There's been no clear etiology. She does have LVH mildly on echo.  At the last visit she was having significant problems with stress and anxiety but this seems to be improved. Her blood pressures well controlled. She does have bradycardia noted that she tolerates this and she has no symptoms related to this. She denies any palpitations, presyncope or syncope.  She has no shortness of breath or chest pain.  Allergies  Allergen Reactions  . Percocet [Oxycodone-Acetaminophen]     Current Outpatient Prescriptions  Medication Sig Dispense Refill  . labetalol (NORMODYNE) 200 MG tablet TAKE (1) TABLET BY MOUTH (3) TIMES DAILY.  90 tablet  11  . levothyroxine (SYNTHROID, LEVOTHROID) 75 MCG tablet Take 75 mcg by mouth daily.        Marland Kitchen losartan (COZAAR) 100 MG tablet TAKE ONE TABLET BY MOUTH ONCE DAILY.  30 tablet  11  . NIFEdipine (PROCARDIA-XL/ADALAT-CC/NIFEDICAL-XL) 30 MG 24 hr tablet TAKE ONE TABLET DAILY.  30 tablet  11   No current facility-administered medications for this visit.    Past Medical History  Diagnosis Date  . HTN (hypertension)   . Hematuria   . Hypothyroid     Past Surgical History  Procedure Laterality Date  . Dilation and curettage of uterus      ROS:  As stated in the HPI and negative for all other systems.  PHYSICAL EXAM BP 120/62  Pulse 42  Ht 5' 1.5" (1.562 m)  Wt 168 lb (76.204 kg)  BMI 31.23 kg/m2 GENERAL:  Well appearing NECK:  No jugular venous distention, waveform within normal limits, carotid upstroke brisk and symmetric, no bruits, no thyromegaly LUNGS:  Clear to auscultation bilaterally CHEST:  Unremarkable HEART:  PMI not displaced or sustained,S1 and S2 within normal limits, no S3, no S4, no clicks, no rubs, no murmurs ABD:  Flat, positive bowel sounds normal in  frequency in pitch, no bruits, no rebound, no guarding, no midline pulsatile mass, no hepatomegaly, no splenomegaly EXT:  2 plus pulses throughout, no edema, no cyanosis no clubbing PSYCH:   Unremarkable.   EKG:  Sinus bradycardia, rate 42, axis WNL, intervals WNL, no acute ST T wave changes, early repolarization changes.  12/18/2013  ASSESSMENT AND PLAN  HTN:  Her blood pressure is well controlled. She will continue the meds as listed. No change in therapy is indicated.  She prefers to be followed in this clinic yearly given her previous problems with blood pressure.  BRADYCARDIA:    She tolerates this. No change in therapy is indicated.

## 2014-04-29 ENCOUNTER — Other Ambulatory Visit (HOSPITAL_COMMUNITY): Payer: Self-pay | Admitting: Family Medicine

## 2014-04-29 DIAGNOSIS — R51 Headache: Principal | ICD-10-CM

## 2014-04-29 DIAGNOSIS — M542 Cervicalgia: Secondary | ICD-10-CM

## 2014-04-29 DIAGNOSIS — R519 Headache, unspecified: Secondary | ICD-10-CM

## 2014-04-29 DIAGNOSIS — D329 Benign neoplasm of meninges, unspecified: Secondary | ICD-10-CM

## 2014-05-05 ENCOUNTER — Ambulatory Visit (HOSPITAL_COMMUNITY)
Admission: RE | Admit: 2014-05-05 | Discharge: 2014-05-05 | Disposition: A | Discharge status: Home / Self Care | Payer: Medicare HMO | Source: Ambulatory Visit | Attending: Family Medicine | Admitting: Family Medicine

## 2014-05-05 DIAGNOSIS — R519 Headache, unspecified: Secondary | ICD-10-CM

## 2014-05-05 DIAGNOSIS — D320 Benign neoplasm of cerebral meninges: Secondary | ICD-10-CM | POA: Diagnosis not present

## 2014-05-05 DIAGNOSIS — M542 Cervicalgia: Secondary | ICD-10-CM

## 2014-05-05 DIAGNOSIS — R51 Headache: Secondary | ICD-10-CM | POA: Insufficient documentation

## 2014-05-05 DIAGNOSIS — D329 Benign neoplasm of meninges, unspecified: Secondary | ICD-10-CM

## 2014-05-05 MED ORDER — IOHEXOL 300 MG/ML  SOLN
80.0000 mL | Freq: Once | INTRAMUSCULAR | Status: AC | PRN
  Administered 2014-05-05: 80 mL via INTRAVENOUS

## 2014-05-16 ENCOUNTER — Other Ambulatory Visit: Payer: Self-pay | Admitting: Cardiology

## 2014-06-01 ENCOUNTER — Emergency Department (HOSPITAL_COMMUNITY): Payer: Medicare HMO

## 2014-06-01 ENCOUNTER — Encounter (HOSPITAL_COMMUNITY): Payer: Self-pay | Admitting: Emergency Medicine

## 2014-06-01 ENCOUNTER — Observation Stay (HOSPITAL_COMMUNITY)
Admission: EM | Admit: 2014-06-01 | Discharge: 2014-06-02 | Disposition: A | Discharge status: Home / Self Care | Payer: Medicare HMO | Attending: Internal Medicine | Admitting: Internal Medicine

## 2014-06-01 DIAGNOSIS — Z79899 Other long term (current) drug therapy: Secondary | ICD-10-CM | POA: Diagnosis not present

## 2014-06-01 DIAGNOSIS — Z87891 Personal history of nicotine dependence: Secondary | ICD-10-CM | POA: Insufficient documentation

## 2014-06-01 DIAGNOSIS — E039 Hypothyroidism, unspecified: Secondary | ICD-10-CM | POA: Diagnosis not present

## 2014-06-01 DIAGNOSIS — I10 Essential (primary) hypertension: Secondary | ICD-10-CM

## 2014-06-01 DIAGNOSIS — R001 Bradycardia, unspecified: Secondary | ICD-10-CM | POA: Insufficient documentation

## 2014-06-01 DIAGNOSIS — R079 Chest pain, unspecified: Secondary | ICD-10-CM | POA: Diagnosis not present

## 2014-06-01 LAB — COMPREHENSIVE METABOLIC PANEL
ALK PHOS: 63 U/L (ref 39–117)
ALT: 22 U/L (ref 0–35)
AST: 20 U/L (ref 0–37)
Albumin: 4.3 g/dL (ref 3.5–5.2)
Anion gap: 8 (ref 5–15)
BILIRUBIN TOTAL: 0.9 mg/dL (ref 0.3–1.2)
BUN: 17 mg/dL (ref 6–23)
CO2: 26 mmol/L (ref 19–32)
Calcium: 9.3 mg/dL (ref 8.4–10.5)
Chloride: 95 mmol/L — ABNORMAL LOW (ref 96–112)
Creatinine, Ser: 0.78 mg/dL (ref 0.50–1.10)
GFR calc Af Amer: >90 mL/min (ref >90)
GFR calc non Af Amer: 80 mL/min — ABNORMAL LOW (ref >90)
GLUCOSE: 105 mg/dL — AB (ref 70–99)
Potassium: 4.1 mmol/L (ref 3.5–5.1)
Sodium: 129 mmol/L — ABNORMAL LOW (ref 135–145)
TOTAL PROTEIN: 7.8 g/dL (ref 6.0–8.3)

## 2014-06-01 LAB — CBC WITH DIFFERENTIAL/PLATELET
Basophils Absolute: 0 10*3/uL (ref 0.0–0.1)
Basophils Relative: 0 % (ref 0–1)
Eosinophils Absolute: 0.1 10*3/uL (ref 0.0–0.7)
Eosinophils Relative: 1 % (ref 0–5)
HCT: 41.3 % (ref 36.0–46.0)
HEMOGLOBIN: 14.8 g/dL (ref 12.0–15.0)
LYMPHS PCT: 21 % (ref 12–46)
Lymphs Abs: 2 10*3/uL (ref 0.7–4.0)
MCH: 32.2 pg (ref 26.0–34.0)
MCHC: 35.8 g/dL (ref 30.0–36.0)
MCV: 90 fL (ref 78.0–100.0)
MONOS PCT: 7 % (ref 3–12)
Monocytes Absolute: 0.7 10*3/uL (ref 0.1–1.0)
NEUTROS ABS: 6.7 10*3/uL (ref 1.7–7.7)
Neutrophils Relative %: 71 % (ref 43–77)
Platelets: 275 10*3/uL (ref 150–400)
RBC: 4.59 MIL/uL (ref 3.87–5.11)
RDW: 12.7 % (ref 11.5–15.5)
WBC: 9.5 10*3/uL (ref 4.0–10.5)

## 2014-06-01 LAB — TROPONIN I
Troponin I: <0.03 ng/mL (ref <0.031)
Troponin I: <0.03 ng/mL (ref <0.031)

## 2014-06-01 LAB — D-DIMER, QUANTITATIVE: D-Dimer, Quant: 0.47 ug/mL-FEU (ref 0.00–0.48)

## 2014-06-01 MED ORDER — LABETALOL HCL 200 MG PO TABS: 200.0000 mg | ORAL_TABLET | Freq: Three times a day (TID) | ORAL | Status: DC

## 2014-06-01 MED ORDER — SODIUM CHLORIDE 0.9 % IV SOLN
INTRAVENOUS | Status: DC
  Administered 2014-06-01: 1000 mL via INTRAVENOUS

## 2014-06-01 MED ORDER — ACETAMINOPHEN 325 MG PO TABS: 650.0000 mg | ORAL_TABLET | Freq: Four times a day (QID) | ORAL | Status: DC | PRN

## 2014-06-01 MED ORDER — ENOXAPARIN SODIUM 40 MG/0.4ML ~~LOC~~ SOLN
40.0000 mg | SUBCUTANEOUS | Status: DC
  Filled 2014-06-01: qty 0.4

## 2014-06-01 MED ORDER — NIFEDIPINE ER OSMOTIC RELEASE 30 MG PO TB24: 30.0000 mg | ORAL_TABLET | Freq: Every day | ORAL | Status: DC

## 2014-06-01 MED ORDER — ACETAMINOPHEN 650 MG RE SUPP: 650.0000 mg | Freq: Four times a day (QID) | RECTAL | Status: DC | PRN

## 2014-06-01 MED ORDER — MORPHINE SULFATE 2 MG/ML IJ SOLN: 2.0000 mg | INTRAMUSCULAR | Status: DC | PRN

## 2014-06-01 MED ORDER — ONDANSETRON HCL 4 MG PO TABS: 4.0000 mg | ORAL_TABLET | Freq: Four times a day (QID) | ORAL | Status: DC | PRN

## 2014-06-01 MED ORDER — LOSARTAN POTASSIUM 50 MG PO TABS: 100.0000 mg | ORAL_TABLET | Freq: Every day | ORAL | Status: DC

## 2014-06-01 MED ORDER — ONDANSETRON HCL 4 MG/2ML IJ SOLN: 4.0000 mg | Freq: Four times a day (QID) | INTRAMUSCULAR | Status: DC | PRN

## 2014-06-01 MED ORDER — HYDROCODONE-ACETAMINOPHEN 5-325 MG PO TABS: 1.0000 | ORAL_TABLET | ORAL | Status: DC | PRN

## 2014-06-01 MED ORDER — LEVOTHYROXINE SODIUM 50 MCG PO TABS: 50.0000 ug | ORAL_TABLET | Freq: Every day | ORAL | Status: DC

## 2014-06-01 MED ORDER — NITROGLYCERIN 2 % TD OINT
1.0000 [in_us] | TOPICAL_OINTMENT | Freq: Once | TRANSDERMAL | Status: AC
  Administered 2014-06-01: 1 [in_us] via TOPICAL
  Filled 2014-06-01: qty 1

## 2014-06-01 MED ORDER — ASPIRIN 325 MG PO TABS
325.0000 mg | ORAL_TABLET | Freq: Once | ORAL | Status: AC
  Administered 2014-06-01: 325 mg via ORAL
  Filled 2014-06-01: qty 1

## 2014-06-01 MED ORDER — ASPIRIN 325 MG PO TABS: 325.0000 mg | ORAL_TABLET | Freq: Every day | ORAL | Status: DC

## 2014-06-01 NOTE — ED Provider Notes (Signed)
CSN: [id number]     Arrival date & time 06/01/14  1122 History  This chart was scribed for Alexis Ferguson, MD by Chester Holstein, ED Scribe. This patient was seen in room APA07/APA07 and the patient's care was started at 12:08 PM.    Chief Complaint  Patient presents with  . Chest Pain      Patient is a 74 y.o. female presenting with chest pain. The history is provided by the patient. No language interpreter was used.  Chest Pain Pain location:  L chest Pain radiates to:  Neck Pain radiates to the back: no   Pain severity:  Severe Onset quality:  Sudden Duration:  1 day Progression:  Worsening Chronicity:  New Associated symptoms: no abdominal pain, no back pain, no cough, no diaphoresis, no fatigue, no headache and no shortness of breath   Risk factors: hypertension    HPI Comments: ELGENE CORAL is a 74 y.o. female with PMHx of HTN and hypothyroid who presents to the Emergency Department complaining of worsening left sided chest pains with onset last night around 10:30 PM lasting 3 hours. Pt states pain radiates from below left breast to central chest and left lower neck. Pt denies similar pain in past. Pt denies h/o MI. She denies diaphoresis and SOB. Pt has a cardiologist.   Past Medical History  Diagnosis Date  . HTN (hypertension)   . Hematuria   . Hypothyroid    Past Surgical History  Procedure Laterality Date  . Dilation and curettage of uterus     Family History  Problem Relation Age of Onset  . Heart disease    . Breast cancer Mother   . Hypertension Other    History  Substance Use Topics  . Smoking status: Former Smoker    Quit date: 05/03/1956  . Smokeless tobacco: Not on file     Comment: Smoked cigarettes only as a teenager  . Alcohol Use: No   OB History    No data available     Review of Systems  Constitutional: Negative for diaphoresis, appetite change and fatigue.  HENT: Negative for congestion, ear discharge and sinus pressure.   Eyes:  Negative for discharge.  Respiratory: Negative for cough and shortness of breath.   Cardiovascular: Positive for chest pain.  Gastrointestinal: Negative for abdominal pain and diarrhea.  Genitourinary: Negative for frequency and hematuria.  Musculoskeletal: Negative for back pain.  Skin: Negative for rash.  Neurological: Negative for seizures and headaches.  Psychiatric/Behavioral: Negative for hallucinations.      Allergies  Percocet  Home Medications   Prior to Admission medications   Medication Sig Start Date End Date Taking? Authorizing Provider  labetalol (NORMODYNE) 200 MG tablet TAKE (1) TABLET BY MOUTH (3) TIMES DAILY. 05/16/14  Yes Minus Breeding, MD  levothyroxine (SYNTHROID, LEVOTHROID) 50 MCG tablet Take 1 tablet by mouth daily. 05/16/14  Yes Historical Provider, MD  losartan (COZAAR) 100 MG tablet TAKE ONE TABLET BY MOUTH ONCE DAILY. 07/17/13  Yes Minus Breeding, MD  NIFEdipine (PROCARDIA-XL/ADALAT-CC/NIFEDICAL-XL) 30 MG 24 hr tablet TAKE ONE TABLET DAILY. 07/17/13  Yes Minus Breeding, MD   BP 211/71 mmHg  Pulse 49  Temp(Src) 98.2 F (36.8 C) (Oral)  Resp 18  Ht 5' 1.5" (1.562 m)  Wt 161 lb (73.029 kg)  BMI 29.93 kg/m2  SpO2 100% Physical Exam  Constitutional: She is oriented to person, place, and time. She appears well-developed.  HENT:  Head: Normocephalic.  Eyes: Conjunctivae and EOM are normal. No scleral  icterus.  Neck: Neck supple. No thyromegaly present.  Cardiovascular: Regular rhythm.  Bradycardia present.  Exam reveals no gallop and no friction rub.   No murmur heard. Pulmonary/Chest: No stridor. She has no wheezes. She has no rales. She exhibits no tenderness.  Abdominal: She exhibits no distension. There is no tenderness. There is no rebound.  Musculoskeletal: Normal range of motion. She exhibits no edema.  Lymphadenopathy:    She has no cervical adenopathy.  Neurological: She is alert and oriented to person, place, and time. She exhibits normal  muscle tone. Coordination normal.  Skin: No rash noted. No erythema.  Psychiatric: She has a normal mood and affect. Her behavior is normal.  Nursing note and vitals reviewed.   ED Course  Procedures (including critical care time) DIAGNOSTIC STUDIES: Oxygen Saturation is 100% on room air, normal by my interpretation.    COORDINATION OF CARE: 12:11 PM Discussed treatment plan with patient at beside, the patient agrees with the plan and has no further questions at this time.   Labs Review Labs Reviewed  CBC WITH DIFFERENTIAL/PLATELET  COMPREHENSIVE METABOLIC PANEL  TROPONIN I    Imaging Review No results found.   EKG Interpretation   Date/Time:  Sunday June 01 2014 11:32:03 EDT Ventricular Rate:  47 PR Interval:  176 QRS Duration: 90 QT Interval:  446 QTC Calculation: 394 R Axis:   63 Text Interpretation:  Sinus bradycardia Otherwise normal ECG Confirmed by  Dewane Timson  MD, Durene Dodge ([id number]) on 06/01/2014 2:48:07 PM      MDM   Final diagnoses:  None   Admit chest pain  The chart was scribed for me under my direct supervision.  I personally performed the history, physical, and medical decision making and all procedures in the evaluation of this patient.Alexis Ferguson, MD 06/01/14 [phone]

## 2014-06-01 NOTE — ED Notes (Signed)
Pt presents to ED complaining of left-sided chest pain.  Pain is mostly in the left breast area and radiating up towards the central chest area, left arm, and left side of the neck.  Pain was rated as 10/10 last night but pt's husband was unable to get her to agree to come to the hospital for evaluation.  Pain is currently rated as 8/10 and described as stabbing.  Denies nausea, vomiting, sweating.  Pt confirms light-headedness.  She has a history of hypertension and has taken her blood pressure medications today.  Current BP is 211/71 in right arm.  Per pt report, patient was once diagnosed with left ventricle enlargement but that "has been disputed since then." Pt appears to be in mild distress.

## 2014-06-01 NOTE — H&P (Signed)
[facility] History and Physical  Alexis Sullivan YQM:[id number] DOB: 10/19/1940 DOA: 06/01/2014  Referring physician: Dr. Roderic Palau, ER PCP: Celedonio Savage, MD   Chief Complaint: chest pain  HPI: Alexis Sullivan is a 74 y.o. female with a history of hypertension, who presents to the emergency room with chest pain. She describes onset of her pain occuring last night when she got into an argument with her husband. She describes chest pain as sharp stabbing pain under her left breast which radiates towards the midsternal area. Denies any radiation into her jaw or left arm. No associated nausea, vomiting, diaphoresis. She did describe some lightheadedness. She may have had some shortness of breath due to pain. Denies any cough, fever. Reports onset of symptoms occurring last night and have persisted until arrival to the emergency room today. Her husband tried to convince her to come to the emergency room last night but she refused. On arrival to the emergency room, she reports improvement of the pain after receiving Nitropaste. She also feels her pain is worse in certain conditions as well as any sort of movements of her left arm. She does not smoke. No family history of premature coronary disease. She denies any recent trauma to the left chest, she's not been doing any heavy lifting. EKG in the emergency room did not show any acute changes cardiac enzymes are negative. She is being referred for admission.   Review of Systems:  Pertinent positives as per HPI, otherwise negative  Past Medical History  Diagnosis Date  . HTN (hypertension)   . Hematuria   . Hypothyroid    Past Surgical History  Procedure Laterality Date  . Dilation and curettage of uterus     Social History:  reports that she quit smoking about 58 years ago. She does not have any smokeless tobacco history on file. She reports that she does not drink alcohol. Her drug history is not on file.  Allergies  Allergen Reactions  .  Percocet [Oxycodone-Acetaminophen]     Family History  Problem Relation Age of Onset  . Heart disease    . Breast cancer Mother   . Hypertension Other     Prior to Admission medications   Medication Sig Start Date End Date Taking? Authorizing Provider  labetalol (NORMODYNE) 200 MG tablet TAKE (1) TABLET BY MOUTH (3) TIMES DAILY. 05/16/14  Yes Minus Breeding, MD  levothyroxine (SYNTHROID, LEVOTHROID) 50 MCG tablet Take 1 tablet by mouth daily. 05/16/14  Yes Historical Provider, MD  losartan (COZAAR) 100 MG tablet TAKE ONE TABLET BY MOUTH ONCE DAILY. 07/17/13  Yes Minus Breeding, MD  NIFEdipine (PROCARDIA-XL/ADALAT-CC/NIFEDICAL-XL) 30 MG 24 hr tablet TAKE ONE TABLET DAILY. 07/17/13  Yes Minus Breeding, MD   Physical Exam: Filed Vitals:   06/01/14 1430 06/01/14 1500 06/01/14 1530 06/01/14 1635  BP: 152/70 141/67 127/59 132/62  Pulse: 51 66 71 67  Temp:    97.7 F (36.5 C)  TempSrc:    Oral  Resp: 14 16 16 14   Height:      Weight:      SpO2: 98% 97% 100% 98%    Wt Readings from Last 3 Encounters:  06/01/14 73.029 kg (161 lb)  12/18/13 76.204 kg (168 lb)  09/04/13 73.029 kg (161 lb)    General:  Appears calm and comfortable Eyes: PERRL, normal lids, irises & conjunctiva ENT: grossly normal hearing, lips & tongue Neck: no LAD, masses or thyromegaly Cardiovascular: RRR, no m/r/g. No LE edema. Telemetry: SR, no arrhythmias  Respiratory: CTA bilaterally, no w/r/r. Normal respiratory effort. Abdomen: soft, ntnd Skin: no rash or induration seen on limited exam Musculoskeletal: grossly normal tone BUE/BLE, tenderness to palpation over left breast. Increased in left chest pain with extension of left shoulder  Psychiatric: grossly normal mood and affect, speech fluent and appropriate Neurologic: grossly non-focal.          Labs on Admission:  Basic Metabolic Panel:  Recent Labs Lab 06/01/14 1206  NA 129*  K 4.1  CL 95*  CO2 26  GLUCOSE 105*  BUN 17  CREATININE 0.78    CALCIUM 9.3   Liver Function Tests:  Recent Labs Lab 06/01/14 1206  AST 20  ALT 22  ALKPHOS 63  BILITOT 0.9  PROT 7.8  ALBUMIN 4.3   No results for input(s): LIPASE, AMYLASE in the last 168 hours. No results for input(s): AMMONIA in the last 168 hours. CBC:  Recent Labs Lab 06/01/14 1206  WBC 9.5  NEUTROABS 6.7  HGB 14.8  HCT 41.3  MCV 90.0  PLT 275   Cardiac Enzymes:  Recent Labs Lab 06/01/14 1206  TROPONINI <0.03    BNP (last 3 results) No results for input(s): BNP in the last 8760 hours.  ProBNP (last 3 results) No results for input(s): PROBNP in the last 8760 hours.  CBG: No results for input(s): GLUCAP in the last 168 hours.  Radiological Exams on Admission: Dg Chest 2 View  06/01/2014   CLINICAL DATA:  Chest pain starting yesterday  EXAM: CHEST  2 VIEW  COMPARISON:  None.  FINDINGS: Cardiomediastinal silhouette is unremarkable. No acute infiltrate or pleural effusion. No pulmonary edema. Osteopenia and mild degenerative changes thoracic spine.  IMPRESSION: No active cardiopulmonary disease. Osteopenia and degenerative changes thoracic spine.   Electronically Signed   By: Lahoma Crocker M.D.   On: 06/01/2014 13:48    EKG: Independently reviewed. Sinus bradycardia, No acute changes  Assessment/Plan Active Problems:   Hypertension, accelerated   Chest pain   Hypothyroidism   1. Chest pain. Appears to be atypical, possible musculoskeletal in origin. Patient will be admitted to the hospital overnight for observation. We'll cycle cardiac markers and check EKG in the morning. Also check d-dimer. If workup is unremarkable and patient is feeling improved, can likely discharge home on the morning of follow-up with her primary cardiologist. Blood pressure was checked in both arms and was found to be in similar range. 2. Hypertension. Blood pressure was not markedly elevated on arrival to the emergency room. She reports that her blood pressure is normally fairly  well controlled. This may be related to stress versus pain. It appears to have stabilized now. Will continue her regular medicines and follow. 3. Hypothyroidism. Continue Synthroid.  Code Status: full code DVT Prophylaxis: lovenox Family Communication: discussed with husband at the bedside Disposition Plan: discharge home once improved  Time spent: 25mins  Jorah Hua [facility] Pager [phone]

## 2014-06-01 NOTE — ED Notes (Signed)
Hospitalist in room with pt. 

## 2014-06-02 DIAGNOSIS — E039 Hypothyroidism, unspecified: Secondary | ICD-10-CM | POA: Diagnosis not present

## 2014-06-02 DIAGNOSIS — R001 Bradycardia, unspecified: Secondary | ICD-10-CM | POA: Diagnosis not present

## 2014-06-02 DIAGNOSIS — I10 Essential (primary) hypertension: Secondary | ICD-10-CM | POA: Diagnosis not present

## 2014-06-02 DIAGNOSIS — R079 Chest pain, unspecified: Secondary | ICD-10-CM | POA: Diagnosis not present

## 2014-06-02 LAB — CBC
HCT: 39.1 % (ref 36.0–46.0)
Hemoglobin: 13.5 g/dL (ref 12.0–15.0)
MCH: 31.1 pg (ref 26.0–34.0)
MCHC: 34.5 g/dL (ref 30.0–36.0)
MCV: 90.1 fL (ref 78.0–100.0)
PLATELETS: 262 10*3/uL (ref 150–400)
RBC: 4.34 MIL/uL (ref 3.87–5.11)
RDW: 12.8 % (ref 11.5–15.5)
WBC: 8.3 10*3/uL (ref 4.0–10.5)

## 2014-06-02 LAB — BASIC METABOLIC PANEL
Anion gap: 7 (ref 5–15)
BUN: 14 mg/dL (ref 6–23)
CO2: 25 mmol/L (ref 19–32)
CREATININE: 0.79 mg/dL (ref 0.50–1.10)
Calcium: 8.6 mg/dL (ref 8.4–10.5)
Chloride: 100 mmol/L (ref 96–112)
GFR calc Af Amer: >90 mL/min (ref >90)
GFR calc non Af Amer: 80 mL/min — ABNORMAL LOW (ref >90)
GLUCOSE: 96 mg/dL (ref 70–99)
POTASSIUM: 3.8 mmol/L (ref 3.5–5.1)
Sodium: 132 mmol/L — ABNORMAL LOW (ref 135–145)

## 2014-06-02 LAB — TROPONIN I
Troponin I: <0.03 ng/mL (ref <0.031)
Troponin I: <0.03 ng/mL (ref <0.031)

## 2014-06-02 NOTE — Progress Notes (Signed)
Patient discharged with instructions, prescription, and care notes.  Verbalized understanding via teach back.  IV was removed and the site was WNL. Patient voiced no further complaints or concerns at the time of discharge.  Appointments scheduled per instructions.  Patient left the floor via via w/c with staff and family in stable condition.

## 2014-06-02 NOTE — Progress Notes (Signed)
UR completed 

## 2014-06-02 NOTE — Discharge Summary (Signed)
Physician Discharge Summary  Alexis Sullivan KPT:[id number] DOB: 17-Jun-1940 DOA: 06/01/2014  PCP: Celedonio Savage, MD  Admit date: 06/01/2014 Discharge date: 06/02/2014  Time spent: 40 minutes  Recommendations for Outpatient Follow-up:  1. Dr Warren Lacy as scheduled 2. PCP 1-2 weeks for evaluation of chest pain  Discharge Diagnoses:  Active Problems:   Hypertension, accelerated   Chest pain   Hypothyroidism   Chest pain at rest   Discharge Condition: stable  Diet recommendation: heart healthy  Filed Weights   06/01/14 1132 06/01/14 1702 06/02/14 0544  Weight: 73.029 kg (161 lb) 73.029 kg (161 lb) 75.388 kg (166 lb 3.2 oz)    History of present illness:  Alexis Sullivan is a 74 y.o. female with a history of hypertension, who presented to the emergency room on 06/01/14 with chest pain. She described onset of her pain occuring the night prior when she got into an argument with her husband. She described chest pain as sharp stabbing pain under her left breast which radiated towards the midsternal area. Denied any radiation into her jaw or left arm. No associated nausea, vomiting, diaphoresis. She did describe some lightheadedness. She may have had some shortness of breath due to pain. Denied any cough, fever. Reported onset of symptoms occurring night prior and persisted until arrival to the emergency room. Her husband tried to convince her to come to the emergency room last night but she refused. On arrival to the emergency room, she reported improvement of the pain after receiving Nitropaste. She also felt her pain was worse in certain conditions as well as any sort of movements of her left arm. She does not smoke. No family history of premature coronary disease. She denied any recent trauma to the left chest, she's not been doing any heavy lifting. EKG in the emergency room did not show any acute changes cardiac enzymes are negative.    Hospital Course:  1. Chest pain. Atypical, likely  musculoskeletal in origin after patient gardening and pushing/pulling wheelbarrow. Pain reproducible.  Cardiac markers negative x3 and EKG without acute changes x2. d-dimer within the limits of normal. Blood pressure was checked in both arms and was found to be in similar range. Follow up with Dr Casimer Lanius as scheduled 2. Hypertension. Controlled 3. Hypothyroidism. Continue Synthroid.  Procedures:  none  Consultations:  none  Discharge Exam: Filed Vitals:   06/02/14 0544  BP: 153/69  Pulse: 65  Temp: 97.6 F (36.4 C)  Resp: 20    General: well nourished appears comfortable Cardiovascular: RRR no MGR No LE edema Respiratory: normal effort BS clear bilaterally no wheeze  Discharge Instructions   Discharge Instructions    Diet - low sodium heart healthy    Complete by:  As directed      Increase activity slowly    Complete by:  As directed           Current Discharge Medication List    CONTINUE these medications which have NOT CHANGED   Details  labetalol (NORMODYNE) 200 MG tablet TAKE (1) TABLET BY MOUTH (3) TIMES DAILY. Qty: 90 tablet, Refills: 3    levothyroxine (SYNTHROID, LEVOTHROID) 50 MCG tablet Take 1 tablet by mouth daily.    losartan (COZAAR) 100 MG tablet TAKE ONE TABLET BY MOUTH ONCE DAILY. Qty: 30 tablet, Refills: 11    NIFEdipine (PROCARDIA-XL/ADALAT-CC/NIFEDICAL-XL) 30 MG 24 hr tablet TAKE ONE TABLET DAILY. Qty: 30 tablet, Refills: 11       Allergies  Allergen Reactions  . Percocet [Oxycodone-Acetaminophen]  Follow-up Information    Follow up with Minus Breeding, MD On 07/16/2014.   Specialty:  Cardiology   Why:  appointment at 3:15   Contact information:   [address] [place] [address] [phone]        The results of significant diagnostics from this hospitalization (including imaging, microbiology, ancillary and laboratory) are listed below for reference.    Significant Diagnostic Studies: Dg Chest 2 View  06/01/2014    CLINICAL DATA:  Chest pain starting yesterday  EXAM: CHEST  2 VIEW  COMPARISON:  None.  FINDINGS: Cardiomediastinal silhouette is unremarkable. No acute infiltrate or pleural effusion. No pulmonary edema. Osteopenia and mild degenerative changes thoracic spine.  IMPRESSION: No active cardiopulmonary disease. Osteopenia and degenerative changes thoracic spine.   Electronically Signed   By: Lahoma Crocker M.D.   On: 06/01/2014 13:48   Ct Head W Wo Contrast  05/05/2014   CLINICAL DATA:  Right-sided headaches.  History meningioma  EXAM: CT HEAD WITHOUT AND WITH CONTRAST  TECHNIQUE: Contiguous axial images were obtained from the base of the skull through the vertex without and with intravenous contrast  CONTRAST:  55mL OMNIPAQUE IOHEXOL 300 MG/ML  SOLN  COMPARISON:  CT head 04/23/2010  FINDINGS: Ventricle size is normal.  Age-appropriate atrophy is present.  Negative for acute or chronic ischemia.  Negative for hemorrhage.  Calcified meningioma right frontal lobe is unchanged. There is thickening of the bone with sclerotic change in the bone. The mass measures approximately 10 mm in diameter and is unchanged. No significant brain edema. The mass is not show significant enhancement.  IMPRESSION: 10 mm right frontal meningioma is calcified and unchanged. No brain edema.  Otherwise negative.   Electronically Signed   By: Franchot Gallo M.D.   On: 05/05/2014 14:58    Microbiology: No results found for this or any previous visit (from the past 240 hour(s)).   Labs: Basic Metabolic Panel:  Recent Labs Lab 06/01/14 1206 06/02/14 0628  NA 129* 132*  K 4.1 3.8  CL 95* 100  CO2 26 25  GLUCOSE 105* 96  BUN 17 14  CREATININE 0.78 0.79  CALCIUM 9.3 8.6   Liver Function Tests:  Recent Labs Lab 06/01/14 1206  AST 20  ALT 22  ALKPHOS 63  BILITOT 0.9  PROT 7.8  ALBUMIN 4.3   No results for input(s): LIPASE, AMYLASE in the last 168 hours. No results for input(s): AMMONIA in the last 168  hours. CBC:  Recent Labs Lab 06/01/14 1206 06/02/14 0628  WBC 9.5 8.3  NEUTROABS 6.7  --   HGB 14.8 13.5  HCT 41.3 39.1  MCV 90.0 90.1  PLT 275 262   Cardiac Enzymes:  Recent Labs Lab 06/01/14 1206 06/01/14 1723 06/01/14 2344 06/02/14 0628  TROPONINI <0.03 <0.03 <0.03 <0.03   BNP: BNP (last 3 results) No results for input(s): BNP in the last 8760 hours.  ProBNP (last 3 results) No results for input(s): PROBNP in the last 8760 hours.  CBG: No results for input(s): GLUCAP in the last 168 hours.     SignedRadene Gunning  [facility] 06/02/2014, 11:19 AM

## 2014-06-18 ENCOUNTER — Ambulatory Visit: Payer: Medicare HMO | Admitting: Cardiology

## 2014-07-16 ENCOUNTER — Ambulatory Visit: Payer: Medicare HMO | Admitting: Cardiology

## 2014-07-29 ENCOUNTER — Other Ambulatory Visit: Payer: Self-pay | Admitting: Cardiology

## 2014-07-29 NOTE — Telephone Encounter (Signed)
Rx(s) sent to pharmacy electronically.  

## 2014-08-12 ENCOUNTER — Other Ambulatory Visit: Payer: Self-pay | Admitting: Cardiology

## 2014-10-08 ENCOUNTER — Other Ambulatory Visit (HOSPITAL_COMMUNITY): Payer: Self-pay | Admitting: Family Medicine

## 2014-10-08 DIAGNOSIS — Z1231 Encounter for screening mammogram for malignant neoplasm of breast: Secondary | ICD-10-CM

## 2014-10-17 ENCOUNTER — Ambulatory Visit (HOSPITAL_COMMUNITY)
Admission: RE | Admit: 2014-10-17 | Discharge: 2014-10-17 | Disposition: A | Discharge status: Home / Self Care | Payer: Medicare HMO | Source: Ambulatory Visit | Attending: Family Medicine | Admitting: Family Medicine

## 2014-10-17 DIAGNOSIS — Z1231 Encounter for screening mammogram for malignant neoplasm of breast: Secondary | ICD-10-CM | POA: Diagnosis present

## 2014-10-22 ENCOUNTER — Other Ambulatory Visit: Payer: Self-pay | Admitting: Cardiology

## 2015-01-20 ENCOUNTER — Other Ambulatory Visit: Payer: Self-pay | Admitting: Cardiology

## 2015-01-22 ENCOUNTER — Other Ambulatory Visit: Payer: Self-pay | Admitting: *Deleted

## 2015-01-22 ENCOUNTER — Telehealth: Payer: Self-pay | Admitting: *Deleted

## 2015-01-22 MED ORDER — LOSARTAN POTASSIUM 100 MG PO TABS: 100.0000 mg | ORAL_TABLET | Freq: Every day | ORAL | Status: DC

## 2015-01-22 NOTE — Telephone Encounter (Signed)
Patient left a voicemail on the refill line requesting losartan. I spoke with patient and she stated that the last time that she saw Dr Percival Spanish, he had told her that if she would like to, she could just follow up with her pcp going forward. She has not been able to get her pcp to authorize this refill, but has an appointment to see them next week. She is aware that I will authorize a one month supply.

## 2015-02-11 ENCOUNTER — Other Ambulatory Visit: Payer: Self-pay | Admitting: Cardiology

## 2015-03-12 ENCOUNTER — Other Ambulatory Visit: Payer: Self-pay | Admitting: Cardiology

## 2015-04-01 DIAGNOSIS — M75100 Unspecified rotator cuff tear or rupture of unspecified shoulder, not specified as traumatic: Secondary | ICD-10-CM | POA: Diagnosis not present

## 2015-04-15 DIAGNOSIS — M25512 Pain in left shoulder: Secondary | ICD-10-CM | POA: Diagnosis not present

## 2015-04-15 DIAGNOSIS — G8929 Other chronic pain: Secondary | ICD-10-CM | POA: Diagnosis not present

## 2015-04-15 DIAGNOSIS — I10 Essential (primary) hypertension: Secondary | ICD-10-CM | POA: Diagnosis not present

## 2015-07-01 DIAGNOSIS — M75100 Unspecified rotator cuff tear or rupture of unspecified shoulder, not specified as traumatic: Secondary | ICD-10-CM | POA: Diagnosis not present

## 2015-10-02 ENCOUNTER — Other Ambulatory Visit (HOSPITAL_COMMUNITY): Payer: Self-pay | Admitting: Family Medicine

## 2015-10-02 DIAGNOSIS — Z1231 Encounter for screening mammogram for malignant neoplasm of breast: Secondary | ICD-10-CM

## 2015-10-06 DIAGNOSIS — M75100 Unspecified rotator cuff tear or rupture of unspecified shoulder, not specified as traumatic: Secondary | ICD-10-CM | POA: Diagnosis not present

## 2015-10-19 ENCOUNTER — Ambulatory Visit (HOSPITAL_COMMUNITY)
Admission: RE | Admit: 2015-10-19 | Discharge: 2015-10-19 | Disposition: A | Discharge status: Home / Self Care | Payer: PPO | Source: Ambulatory Visit | Attending: Family Medicine | Admitting: Family Medicine

## 2015-10-19 DIAGNOSIS — Z1231 Encounter for screening mammogram for malignant neoplasm of breast: Secondary | ICD-10-CM | POA: Diagnosis not present

## 2016-03-25 DIAGNOSIS — I10 Essential (primary) hypertension: Secondary | ICD-10-CM | POA: Diagnosis not present

## 2016-03-25 DIAGNOSIS — Z1211 Encounter for screening for malignant neoplasm of colon: Secondary | ICD-10-CM | POA: Diagnosis not present

## 2016-03-25 DIAGNOSIS — Z23 Encounter for immunization: Secondary | ICD-10-CM | POA: Diagnosis not present

## 2016-03-25 DIAGNOSIS — Z0000 Encounter for general adult medical examination without abnormal findings: Secondary | ICD-10-CM | POA: Diagnosis not present

## 2016-03-25 DIAGNOSIS — E039 Hypothyroidism, unspecified: Secondary | ICD-10-CM | POA: Diagnosis not present

## 2016-03-25 DIAGNOSIS — M75100 Unspecified rotator cuff tear or rupture of unspecified shoulder, not specified as traumatic: Secondary | ICD-10-CM | POA: Diagnosis not present

## 2016-03-25 DIAGNOSIS — Z1231 Encounter for screening mammogram for malignant neoplasm of breast: Secondary | ICD-10-CM | POA: Diagnosis not present

## 2016-03-25 DIAGNOSIS — E559 Vitamin D deficiency, unspecified: Secondary | ICD-10-CM | POA: Diagnosis not present

## 2016-03-25 DIAGNOSIS — Z124 Encounter for screening for malignant neoplasm of cervix: Secondary | ICD-10-CM | POA: Diagnosis not present

## 2016-03-25 DIAGNOSIS — E7801 Familial hypercholesterolemia: Secondary | ICD-10-CM | POA: Diagnosis not present

## 2016-07-21 DIAGNOSIS — B001 Herpesviral vesicular dermatitis: Secondary | ICD-10-CM | POA: Diagnosis not present

## 2016-08-09 DIAGNOSIS — I10 Essential (primary) hypertension: Secondary | ICD-10-CM | POA: Diagnosis not present

## 2016-08-09 DIAGNOSIS — K130 Diseases of lips: Secondary | ICD-10-CM | POA: Diagnosis not present

## 2016-08-09 DIAGNOSIS — Z6830 Body mass index (BMI) 30.0-30.9, adult: Secondary | ICD-10-CM | POA: Diagnosis not present

## 2016-08-09 DIAGNOSIS — E039 Hypothyroidism, unspecified: Secondary | ICD-10-CM | POA: Diagnosis not present

## 2016-08-09 DIAGNOSIS — K219 Gastro-esophageal reflux disease without esophagitis: Secondary | ICD-10-CM | POA: Diagnosis not present

## 2016-08-16 DIAGNOSIS — D485 Neoplasm of uncertain behavior of skin: Secondary | ICD-10-CM | POA: Diagnosis not present

## 2016-08-16 DIAGNOSIS — L568 Other specified acute skin changes due to ultraviolet radiation: Secondary | ICD-10-CM | POA: Diagnosis not present

## 2016-08-16 DIAGNOSIS — B379 Candidiasis, unspecified: Secondary | ICD-10-CM | POA: Diagnosis not present

## 2016-08-16 DIAGNOSIS — L309 Dermatitis, unspecified: Secondary | ICD-10-CM | POA: Diagnosis not present

## 2016-09-27 ENCOUNTER — Other Ambulatory Visit (HOSPITAL_COMMUNITY): Payer: Self-pay | Admitting: Family Medicine

## 2016-09-27 DIAGNOSIS — Z1231 Encounter for screening mammogram for malignant neoplasm of breast: Secondary | ICD-10-CM

## 2016-10-20 ENCOUNTER — Ambulatory Visit (HOSPITAL_COMMUNITY)
Admission: RE | Admit: 2016-10-20 | Discharge: 2016-10-20 | Disposition: A | Discharge status: Home / Self Care | Payer: PPO | Source: Ambulatory Visit | Attending: Family Medicine | Admitting: Family Medicine

## 2016-10-20 DIAGNOSIS — Z1231 Encounter for screening mammogram for malignant neoplasm of breast: Secondary | ICD-10-CM | POA: Insufficient documentation

## 2016-11-30 DIAGNOSIS — I10 Essential (primary) hypertension: Secondary | ICD-10-CM | POA: Diagnosis not present

## 2016-11-30 DIAGNOSIS — E039 Hypothyroidism, unspecified: Secondary | ICD-10-CM | POA: Diagnosis not present

## 2016-11-30 DIAGNOSIS — Z6830 Body mass index (BMI) 30.0-30.9, adult: Secondary | ICD-10-CM | POA: Diagnosis not present

## 2016-11-30 DIAGNOSIS — K219 Gastro-esophageal reflux disease without esophagitis: Secondary | ICD-10-CM | POA: Diagnosis not present

## 2016-11-30 DIAGNOSIS — Z0000 Encounter for general adult medical examination without abnormal findings: Secondary | ICD-10-CM | POA: Diagnosis not present

## 2016-12-06 DIAGNOSIS — I10 Essential (primary) hypertension: Secondary | ICD-10-CM | POA: Diagnosis not present

## 2016-12-06 DIAGNOSIS — K219 Gastro-esophageal reflux disease without esophagitis: Secondary | ICD-10-CM | POA: Diagnosis not present

## 2016-12-06 DIAGNOSIS — Z6830 Body mass index (BMI) 30.0-30.9, adult: Secondary | ICD-10-CM | POA: Diagnosis not present

## 2016-12-06 DIAGNOSIS — E039 Hypothyroidism, unspecified: Secondary | ICD-10-CM | POA: Diagnosis not present

## 2016-12-06 DIAGNOSIS — T753XXA Motion sickness, initial encounter: Secondary | ICD-10-CM | POA: Diagnosis not present

## 2017-02-27 IMAGING — CT CT HEAD WO/W CM
1 of 2 series · 13 of 30 positions shown, 17 images · IV contrast (omnipaque)
Comparison: CT head 04/23/2010

CLINICAL DATA: Right-sided headaches.  History meningioma

EXAM:
CT HEAD WITHOUT AND WITH CONTRAST
TECHNIQUE: Contiguous axial images were obtained from the base of the skull
through the vertex without and with intravenous contrast
CONTRAST:  80mL OMNIPAQUE IOHEXOL 300 MG/ML  SOLN

[Series 2: head w/o 4.8 h37s · axial · non-contrast · 0.43mm/px · z∈[+98,+243]mm · 13 of 36 slices shown, 17 images]
[im 3/36  brain]
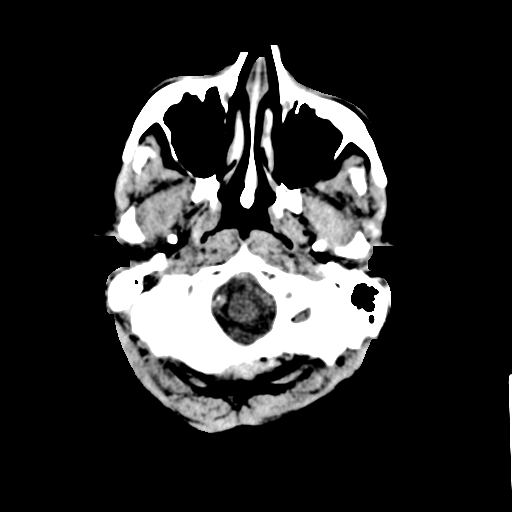
[im 3/36  bone]
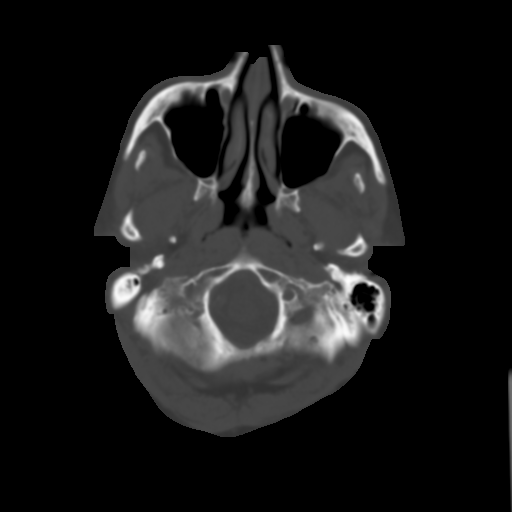
[im 6/36  brain]
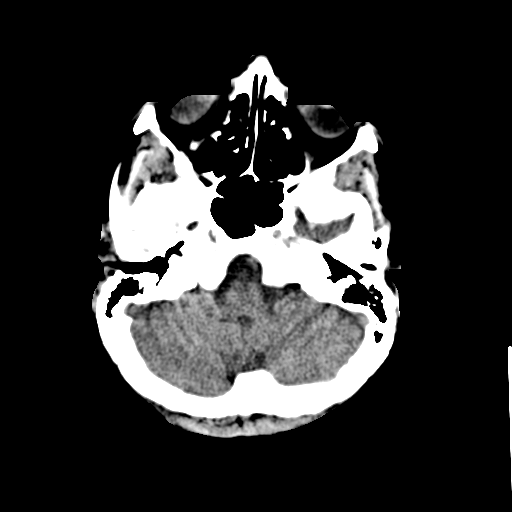
[im 8/36  brain]
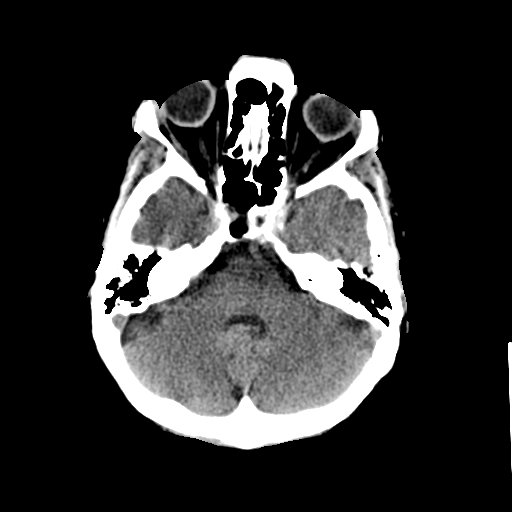
[im 11/36  brain]
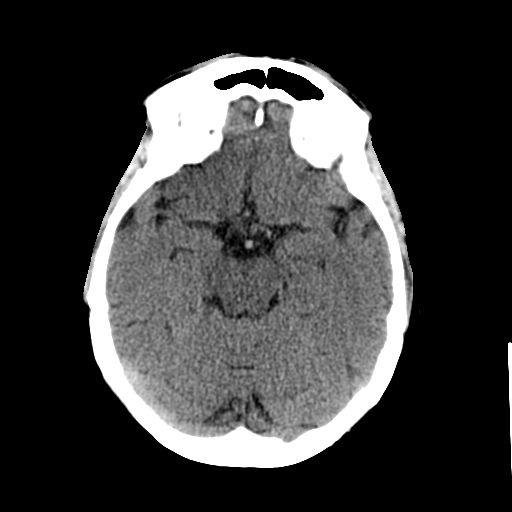
[im 13/36  brain]
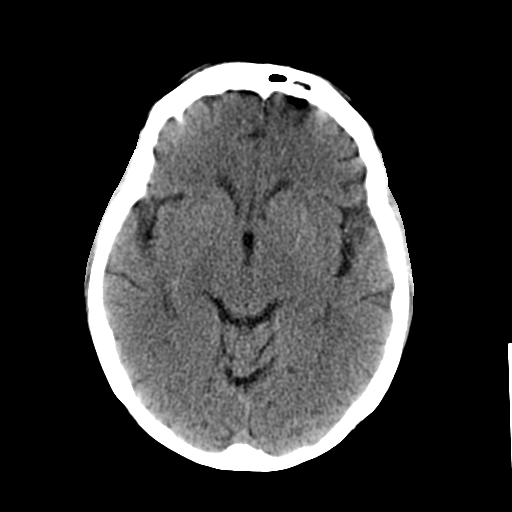
[im 13/36  bone]
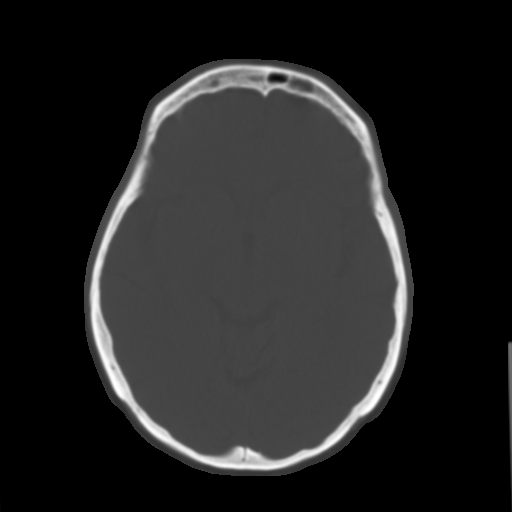
[im 16/36  brain]
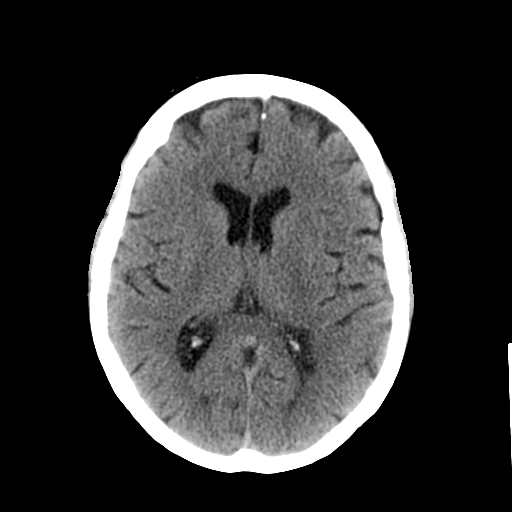
[im 18/36  brain]
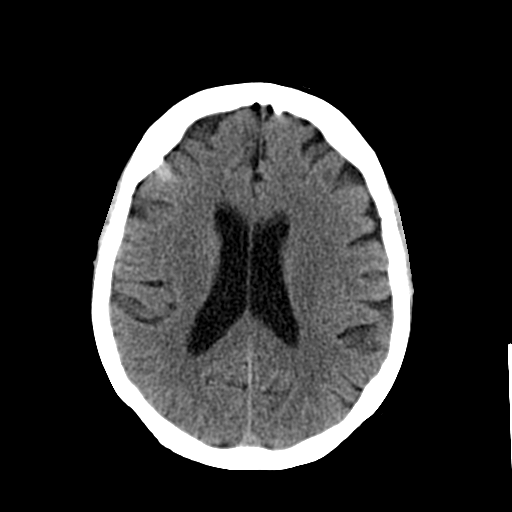
[im 21/36  brain]
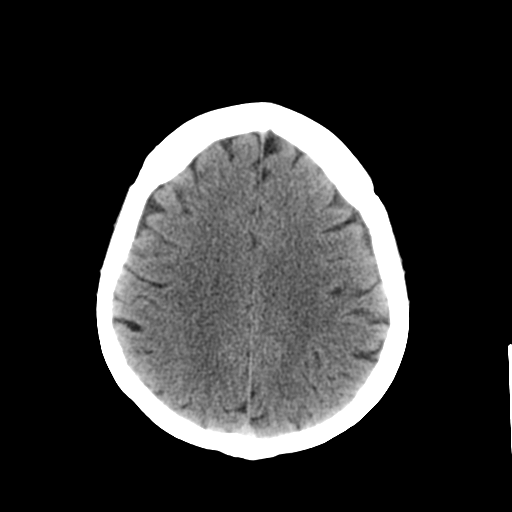
[im 23/36  brain]
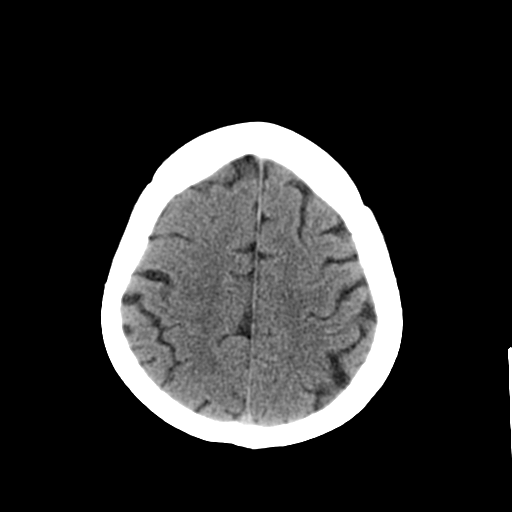
[im 23/36  bone]
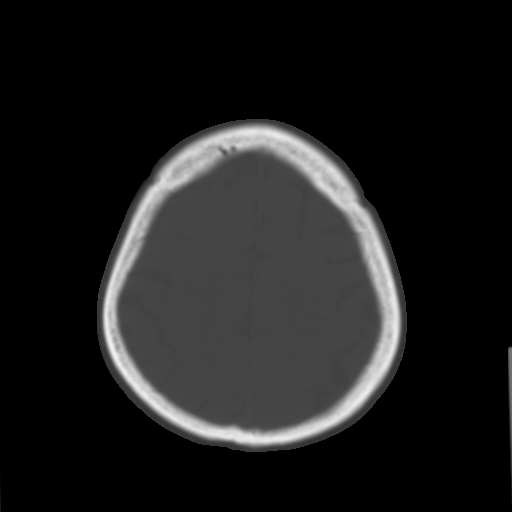
[im 26/36  brain]
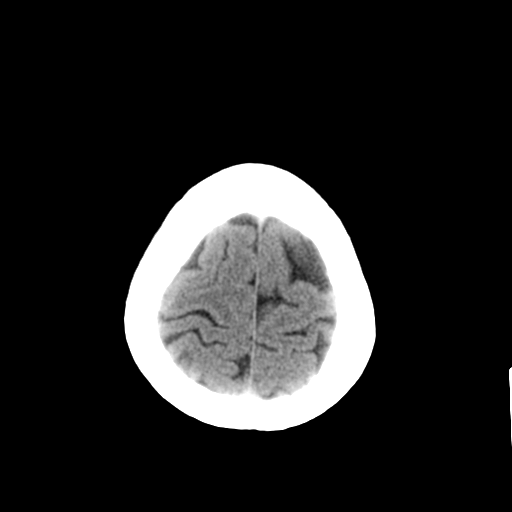
[im 28/36  brain]
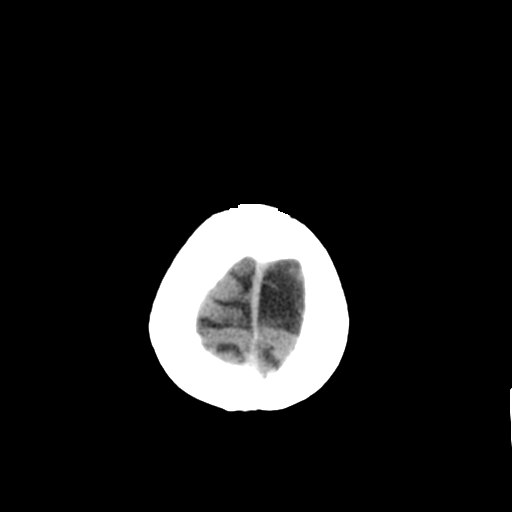
[im 31/36  brain]
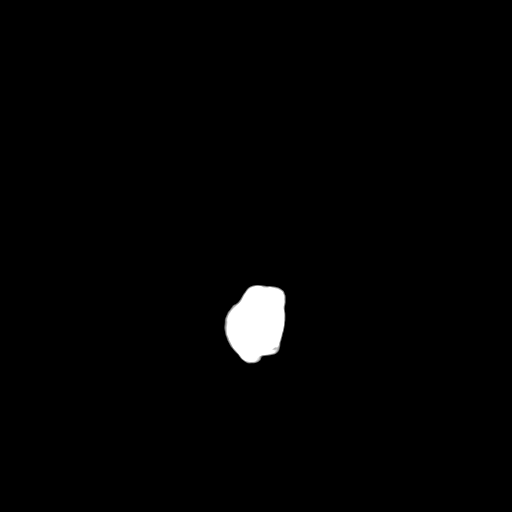
[im 33/36  brain]
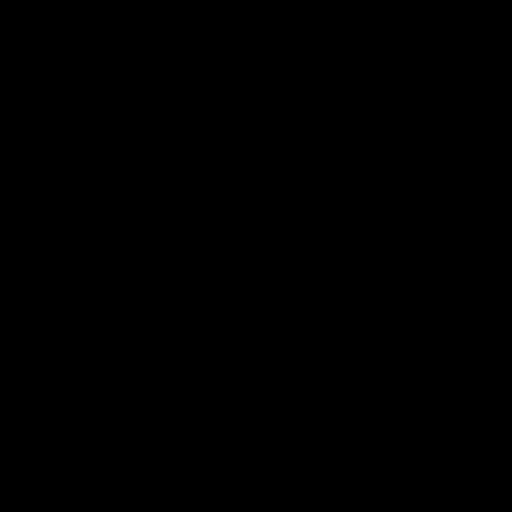
[im 33/36  bone]
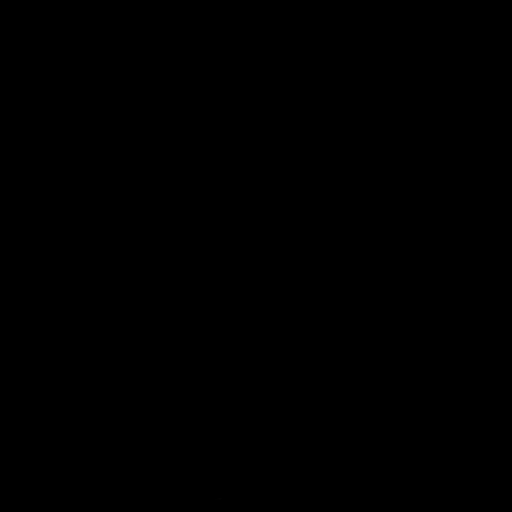

[13 of 30 positions shown; findings below may reference images not displayed]

FINDINGS: Ventricle size is normal.  Age-appropriate atrophy is present.

Negative for acute or chronic ischemia.  Negative for hemorrhage.

Calcified meningioma right frontal lobe is unchanged. There is
thickening of the bone with sclerotic change in the bone. The mass
measures approximately 10 mm in diameter and is unchanged. No
significant brain edema. The mass is not show significant
enhancement.
IMPRESSION: 10 mm right frontal meningioma is calcified and unchanged. No brain
edema.

Otherwise negative.

## 2021-02-21 DEATH — deceased
